# Patient Record
Sex: Female | Born: 1955
Health system: Southern US, Community
[De-identification: ages and names within clinical notes are randomized; demographics above are authoritative.]

## PROBLEM LIST (undated history)

## (undated) DIAGNOSIS — M549 Dorsalgia, unspecified: Secondary | ICD-10-CM

## (undated) DIAGNOSIS — M5432 Sciatica, left side: Secondary | ICD-10-CM

## (undated) DIAGNOSIS — G8929 Other chronic pain: Secondary | ICD-10-CM

## (undated) DIAGNOSIS — R112 Nausea with vomiting, unspecified: Secondary | ICD-10-CM

## (undated) DIAGNOSIS — Z9889 Other specified postprocedural states: Secondary | ICD-10-CM

## (undated) HISTORY — PX: MELANOMA EXCISION: SHX5266

## (undated) HISTORY — DX: Sciatica, left side: M54.32

## (undated) HISTORY — PX: ESOPHAGOGASTRODUODENOSCOPY: SHX1529

---

## 1997-12-16 ENCOUNTER — Ambulatory Visit (HOSPITAL_COMMUNITY): Admission: RE | Admit: 1997-12-16 | Discharge: 1997-12-16 | Payer: Self-pay | Admitting: *Deleted

## 1998-12-29 ENCOUNTER — Ambulatory Visit (HOSPITAL_COMMUNITY): Admission: RE | Admit: 1998-12-29 | Discharge: 1998-12-29 | Payer: Self-pay | Admitting: *Deleted

## 1999-05-12 ENCOUNTER — Other Ambulatory Visit: Admission: RE | Admit: 1999-05-12 | Discharge: 1999-05-12 | Payer: Self-pay | Admitting: *Deleted

## 2000-01-04 ENCOUNTER — Ambulatory Visit (HOSPITAL_COMMUNITY): Admission: RE | Admit: 2000-01-04 | Discharge: 2000-01-04 | Payer: Self-pay | Admitting: *Deleted

## 2000-06-06 ENCOUNTER — Other Ambulatory Visit: Admission: RE | Admit: 2000-06-06 | Discharge: 2000-06-06 | Payer: Self-pay | Admitting: *Deleted

## 2001-01-07 ENCOUNTER — Ambulatory Visit (HOSPITAL_COMMUNITY): Admission: RE | Admit: 2001-01-07 | Discharge: 2001-01-07 | Payer: Self-pay | Admitting: *Deleted

## 2001-06-11 ENCOUNTER — Other Ambulatory Visit: Admission: RE | Admit: 2001-06-11 | Discharge: 2001-06-11 | Payer: Self-pay | Admitting: *Deleted

## 2002-01-08 ENCOUNTER — Ambulatory Visit (HOSPITAL_COMMUNITY): Admission: RE | Admit: 2002-01-08 | Discharge: 2002-01-08 | Payer: Self-pay | Admitting: *Deleted

## 2002-03-13 HISTORY — PX: COLONOSCOPY: SHX174

## 2002-06-17 ENCOUNTER — Other Ambulatory Visit: Admission: RE | Admit: 2002-06-17 | Discharge: 2002-06-17 | Payer: Self-pay | Admitting: *Deleted

## 2002-09-22 ENCOUNTER — Encounter: Admission: RE | Admit: 2002-09-22 | Discharge: 2002-09-22 | Payer: Self-pay

## 2002-11-23 ENCOUNTER — Encounter: Payer: Self-pay | Admitting: Emergency Medicine

## 2002-11-23 ENCOUNTER — Emergency Department (HOSPITAL_COMMUNITY): Admission: EM | Admit: 2002-11-23 | Discharge: 2002-11-23 | Payer: Self-pay | Admitting: Emergency Medicine

## 2002-11-27 ENCOUNTER — Encounter: Payer: Self-pay | Admitting: Gastroenterology

## 2002-11-27 ENCOUNTER — Encounter: Admission: RE | Admit: 2002-11-27 | Discharge: 2002-11-27 | Payer: Self-pay | Admitting: Gastroenterology

## 2002-12-05 ENCOUNTER — Encounter (INDEPENDENT_AMBULATORY_CARE_PROVIDER_SITE_OTHER): Payer: Self-pay | Admitting: Specialist

## 2002-12-05 ENCOUNTER — Ambulatory Visit (HOSPITAL_COMMUNITY): Admission: RE | Admit: 2002-12-05 | Discharge: 2002-12-05 | Payer: Self-pay | Admitting: Gastroenterology

## 2002-12-15 ENCOUNTER — Encounter: Payer: Self-pay | Admitting: Gastroenterology

## 2002-12-15 ENCOUNTER — Ambulatory Visit (HOSPITAL_COMMUNITY): Admission: RE | Admit: 2002-12-15 | Discharge: 2002-12-15 | Payer: Self-pay | Admitting: Gastroenterology

## 2003-01-21 ENCOUNTER — Ambulatory Visit (HOSPITAL_COMMUNITY): Admission: RE | Admit: 2003-01-21 | Discharge: 2003-01-21 | Payer: Self-pay | Admitting: *Deleted

## 2003-04-10 ENCOUNTER — Encounter: Admission: RE | Admit: 2003-04-10 | Discharge: 2003-04-10 | Payer: Self-pay | Admitting: Gastroenterology

## 2003-07-21 ENCOUNTER — Other Ambulatory Visit: Admission: RE | Admit: 2003-07-21 | Discharge: 2003-07-21 | Payer: Self-pay | Admitting: *Deleted

## 2004-02-08 ENCOUNTER — Ambulatory Visit (HOSPITAL_COMMUNITY): Admission: RE | Admit: 2004-02-08 | Discharge: 2004-02-08 | Payer: Self-pay | Admitting: *Deleted

## 2005-03-07 ENCOUNTER — Ambulatory Visit (HOSPITAL_COMMUNITY): Admission: RE | Admit: 2005-03-07 | Discharge: 2005-03-07 | Payer: Self-pay | Admitting: *Deleted

## 2006-03-15 ENCOUNTER — Ambulatory Visit (HOSPITAL_COMMUNITY): Admission: RE | Admit: 2006-03-15 | Discharge: 2006-03-15 | Payer: Self-pay | Admitting: *Deleted

## 2006-05-29 ENCOUNTER — Ambulatory Visit: Payer: Self-pay | Admitting: Internal Medicine

## 2006-05-29 LAB — CONVERTED CEMR LAB
Basophils Relative: 1.7 % — ABNORMAL HIGH (ref 0.0–1.0)
Bilirubin, Direct: 0.2 mg/dL (ref 0.0–0.3)
CO2: 29 meq/L (ref 19–32)
Eosinophils Absolute: 0.1 10*3/uL (ref 0.0–0.6)
Eosinophils Relative: 1.2 % (ref 0.0–5.0)
Free T4: 0.8 ng/dL (ref 0.6–1.6)
GFR calc Af Amer: 98 mL/min
GFR calc non Af Amer: 81 mL/min
Glucose, Bld: 75 mg/dL (ref 70–99)
HCT: 39.8 % (ref 36.0–46.0)
Hemoglobin: 14 g/dL (ref 12.0–15.0)
Lymphocytes Relative: 27.5 % (ref 12.0–46.0)
MCV: 94.3 fL (ref 78.0–100.0)
Monocytes Absolute: 0.2 10*3/uL (ref 0.2–0.7)
Neutro Abs: 5.5 10*3/uL (ref 1.4–7.7)
Neutrophils Relative %: 67 % (ref 43.0–77.0)
Sed Rate: 11 mm/hr (ref 0–25)
Sodium: 140 meq/L (ref 135–145)
T3, Free: 3 pg/mL (ref 2.3–4.2)
Total Protein: 6.6 g/dL (ref 6.0–8.3)
WBC: 8.2 10*3/uL (ref 4.5–10.5)

## 2007-03-18 ENCOUNTER — Ambulatory Visit (HOSPITAL_COMMUNITY): Admission: RE | Admit: 2007-03-18 | Discharge: 2007-03-18 | Payer: Self-pay | Admitting: Obstetrics and Gynecology

## 2007-10-22 ENCOUNTER — Encounter: Payer: Self-pay | Admitting: Internal Medicine

## 2008-01-09 ENCOUNTER — Ambulatory Visit: Payer: Self-pay | Admitting: Internal Medicine

## 2008-01-09 DIAGNOSIS — Z85828 Personal history of other malignant neoplasm of skin: Secondary | ICD-10-CM | POA: Insufficient documentation

## 2008-01-09 DIAGNOSIS — Z85820 Personal history of malignant melanoma of skin: Secondary | ICD-10-CM | POA: Insufficient documentation

## 2008-02-25 ENCOUNTER — Telehealth (INDEPENDENT_AMBULATORY_CARE_PROVIDER_SITE_OTHER): Payer: Self-pay | Admitting: *Deleted

## 2008-03-23 ENCOUNTER — Ambulatory Visit (HOSPITAL_COMMUNITY): Admission: RE | Admit: 2008-03-23 | Discharge: 2008-03-23 | Payer: Self-pay | Admitting: Obstetrics and Gynecology

## 2009-03-29 ENCOUNTER — Ambulatory Visit (HOSPITAL_COMMUNITY): Admission: RE | Admit: 2009-03-29 | Discharge: 2009-03-29 | Payer: Self-pay | Admitting: Obstetrics and Gynecology

## 2010-03-13 HISTORY — PX: CERVICAL SPINE SURGERY: SHX589

## 2010-03-30 ENCOUNTER — Ambulatory Visit (HOSPITAL_COMMUNITY)
Admission: RE | Admit: 2010-03-30 | Discharge: 2010-03-30 | Payer: Self-pay | Source: Home / Self Care | Attending: Obstetrics and Gynecology | Admitting: Obstetrics and Gynecology

## 2010-04-06 ENCOUNTER — Encounter
Admission: RE | Admit: 2010-04-06 | Discharge: 2010-04-06 | Payer: Self-pay | Source: Home / Self Care | Attending: Obstetrics and Gynecology | Admitting: Obstetrics and Gynecology

## 2010-04-13 ENCOUNTER — Encounter: Payer: Self-pay | Admitting: Obstetrics and Gynecology

## 2010-07-29 NOTE — Assessment & Plan Note (Signed)
[facility]                        [place] OFFICE NOTE   Deborah, Mccormick                       MRN:          [id number]  DATE:05/29/2006                            DOB:          1955-08-10    Was seen 05/29/2006 for an acute problem.   On Saturday March 15 she was descending the stairs at approximating 4  p.m. when she noted blurring of the vision in her left eye.  This was  associated with localized retroorbital pressure.  She described it as if  the eye were covered in mucus or under water.  She took out her  contacts and did note some clearing, but she had residual pressure.  To  this point she feels like something is in the eye, but she has  difficulty clarifying or defining it better.   PAST MEDICAL HISTORY:  Reveals 2 pregnancies and 2 deliveries.  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  She was treated as an outpatient in the emergency room for  gastroenteritis.   Her father died of cancer of unknown primary.  Apparently he was  estranged from the family for almost 2 decades.  Mother had a myocardial  infarction with coronary bypass grafting.   Tristine is very physically active, exercising regularly in a gym.  She is  on no diet.   She is allergic to a PRESERVATIVE IN FLU SHOTS, METHIMAZOLE.   She is on glucosamine, sucralfate, Naprosyn, Centrum, Calcium Plus D and  a birth control pill.  She has been followed by Dr. Gildardo Griffes .   REVIEW OF SYSTEMS:  Completed in toto.  She has no cardiopulmonary  symptoms or other neurologic or ophthalmologic symptoms.   She has noted that she has difficulty wearing her rings and questions  were it may be some enlargement of the knuckles rather than edema.She  feels NSAIDs are invaluable in allowing to continue high levels of  cardiovascular exercise.   PHYSICAL EXAMINATION:  She is almost 5 foot 3 inches, weighs 120,  temperature is  97.6, pulse 80, respiratory rate 16, blood pressure  110/78.  Pupils were equal and round and reactive to light.  There is no abnormal  pigmentation of the eyelids.  The ophthalmologic exam was unremarkable.  Field of vision and extraocular motions are normal.  She does exhibit  some lid lag.  She has no carotid bruits.  There is no lymphadenopathy about the head, neck or axilla.  The thyroid has a slightly irregular surface, but no definite  nodularity.  She has an S4 with no murmurs or gallops.  Chest was clear to auscultation.  There is no organomegaly or masses.  The dorsalis pedis pulses are slightly decreased.  She has no edema,  visible or palpable.  The neurologic exam revealed no deficits although she had minimal  difficulty with  rapid alliteration.   Air conduction is clearer than bone conduction and there is no  lateralization with tuning fork.  She did state she questioned some  hearing loss, but this was not documented  on the exam.   She has an appointment to see her ophthalmologist on the 24th, and I  will encourage her to keep that.   I will perform CMET, CBC and diff, sed rate and full thyroid function  tests.   She did provide me with copies of a lipid profile from August 2007,  which was excellent.  TSH was normal at that time.   If these studies are negative and the ophthalmology exam is unremarkable  and symptoms persist, then I would recommend neuroimaging.  I will ask  her to take a baby-coated aspirin each day.     Titus Dubin. Alwyn Ren, MD,FACP,FCCP  Electronically Signed    [facility]/MedQ  DD: 05/29/2006  DT: 05/30/2006  Job #: [id number]   cc:   Bayard Beaver. Nelle Don, M.D.

## 2010-07-29 NOTE — Op Note (Signed)
   NAME:  [name], [name]                          ACCOUNT NO.:  [id number]   MEDICAL RECORD NO.:  [id number]                   PATIENT TYPE:  AMB   LOCATION:  ENDO                                 FACILITY:  [facility]   PHYSICIAN:  Bernette Redbird, M.D.                DATE OF BIRTH:  11-Jan-1956   DATE OF PROCEDURE:  12/05/2002  DATE OF DISCHARGE:                                 OPERATIVE REPORT   PROCEDURE:  Colonoscopy with biopsies.   INDICATION:  Diffuse abdominal pain and diarrhea occurring in an episodic  fashion in a healthy 55 year old female.   FINDINGS:  Normal exam to the terminal ileum.   DESCRIPTION OF PROCEDURE:  The nature, purpose, and risks of the procedure  had been discussed with the patient who provided written consent.  Sedation  for this procedure and the upper endoscopy which preceded it totaled  fentanyl 100 mcg and Versed 9 mg IV without arrhythmias or desaturation.  The Olympus adjustable-tension pediatric video colonoscope was advanced with  mild to moderate difficulty due to some looping in this petite patient.  Ultimately, with the patient in the supine position and external abdominal  compression, I was able to enter the terminal ileum which had a normal  appearance.  Biopsies were obtained, and pullback was then performed in a  gradual fashion around the colon.  The quality of the prep was excellent,  and it is felt that all areas were well-seen.   The colonic mucosa looked normal.  No polyps, cancer, colitis, vascular  malformations, or diverticulosis were noted, and retroflexion in the rectum  as well as re-inspection of the rectum was unremarkable.  Random mucosal  biopsies were obtained along the length of the colon.  The patient tolerated  the procedure well, and there were no apparent complications.   IMPRESSION:  Normal colonoscopy.  No source of symptoms endoscopically  evident.   PLAN:  Await pathology results.                   Bernette Redbird, M.D.    RB/MEDQ  D:  12/05/2002  T:  12/05/2002  Job:  [id number]   cc:   Christella Noa, M.D.  [address]  [place], [place] [address]  Fax: [phone]

## 2010-07-29 NOTE — Op Note (Signed)
   NAME:  [name], [name]                          ACCOUNT NO.:  [id number]   MEDICAL RECORD NO.:  [id number]                   PATIENT TYPE:  AMB   LOCATION:  ENDO                                 [facility]ACILITY:  [facility]   PHYSICIAN:  Bernette Redbird, M.D.                DATE OF BIRTH:  03-18-1955   DATE OF PROCEDURE:  12/05/2002  DATE OF DISCHARGE:                                 OPERATIVE REPORT   PROCEDURE:  Upper endoscopy with biopsies.   INDICATION:  A 55 year old female with nonspecific abdominal symptoms,  including pain and diarrhea.   FINDINGS:  Mild bile reflux and questionable duodenal mucosal villous  atrophy.   DESCRIPTION OF PROCEDURE:  The nature, purpose, and risks of the procedure  had been discussed with the patient who provided written consent.  Sedation  was fentanyl 100 mcg and Versed 9 mg IV prior to and during this exam and  the colonoscopy which followed it.  The Olympus small-caliber adult video  endoscope was passed under direct vision.  The esophagus was normal without  evidence of reflux esophagitis, Barrett's esophagus, varices, infection, or  neoplasia.  No ring, stricture, or hiatal hernia was appreciated.   The stomach contained a small amount of bile and had some faint erythema of  the gastric mucosa but nothing that I would really consider bile reflux  gastritis.  No focal lesions such as erosions, ulcers, polyps, or masses  were seen, and retroflexed viewing of the cardia was unremarkable.  The  pylorus was normal.   The duodenal bulb and second duodenum, however, appeared abnormal.  There  were fewer duodenal mucosal folds than normal, there was faint  hypervascularity, some minimal scalloping of the edges of the duodenal  folds, and a glistening smooth surface just above villous atrophy of the  duodenal mucosa.  Multiple biopsies were obtained prior to removal of the  scope.   The patient tolerated the procedure well, and there were no apparent  complications.   IMPRESSION:  1. Mild bile reflux, ? gastric motility disorder.  2. 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    PLAN:  1. Await pathology on duodenal and gastric biopsies.  2. Proceed to colonoscopic evaluation.                                               Bernette Redbird, M.D.    RB/MEDQ  D:  12/05/2002  T:  12/05/2002  Job:  [id number]   cc:   Christella Noa, M.D.  [address]  [place], [place] [address]  Fax: [phone]

## 2010-09-05 ENCOUNTER — Ambulatory Visit
Admission: RE | Admit: 2010-09-05 | Discharge: 2010-09-05 | Disposition: A | Discharge status: Home / Self Care | Payer: Managed Care, Other (non HMO) | Source: Ambulatory Visit | Attending: Neurosurgery | Admitting: Neurosurgery

## 2010-09-05 ENCOUNTER — Other Ambulatory Visit: Payer: Self-pay | Admitting: Neurosurgery

## 2010-09-05 DIAGNOSIS — M542 Cervicalgia: Secondary | ICD-10-CM

## 2010-09-05 DIAGNOSIS — R51 Headache: Secondary | ICD-10-CM

## 2010-09-05 DIAGNOSIS — M5000 Cervical disc disorder with myelopathy, unspecified cervical region: Secondary | ICD-10-CM

## 2010-09-05 DIAGNOSIS — M5030 Other cervical disc degeneration, unspecified cervical region: Secondary | ICD-10-CM

## 2010-09-23 ENCOUNTER — Encounter (HOSPITAL_COMMUNITY)
Admission: RE | Admit: 2010-09-23 | Discharge: 2010-09-23 | Disposition: A | Discharge status: Home / Self Care | Payer: Managed Care, Other (non HMO) | Source: Ambulatory Visit | Attending: Neurosurgery | Admitting: Neurosurgery

## 2010-09-23 LAB — CBC
HCT: 42 % (ref 36.0–46.0)
MCH: 32.8 pg (ref 26.0–34.0)
MCV: 91.9 fL (ref 78.0–100.0)
Platelets: 304 10*3/uL (ref 150–400)
RBC: 4.57 MIL/uL (ref 3.87–5.11)
WBC: 8.1 10*3/uL (ref 4.0–10.5)

## 2010-09-30 ENCOUNTER — Observation Stay (HOSPITAL_COMMUNITY)
Admission: RE | Admit: 2010-09-30 | Discharge: 2010-10-01 | Disposition: A | Discharge status: Home / Self Care | Payer: Managed Care, Other (non HMO) | Source: Ambulatory Visit | Attending: Neurosurgery | Admitting: Neurosurgery

## 2010-09-30 ENCOUNTER — Ambulatory Visit (HOSPITAL_COMMUNITY): Payer: Managed Care, Other (non HMO)

## 2010-09-30 DIAGNOSIS — Z79899 Other long term (current) drug therapy: Secondary | ICD-10-CM | POA: Insufficient documentation

## 2010-09-30 DIAGNOSIS — M47812 Spondylosis without myelopathy or radiculopathy, cervical region: Principal | ICD-10-CM | POA: Insufficient documentation

## 2010-09-30 DIAGNOSIS — Z01812 Encounter for preprocedural laboratory examination: Secondary | ICD-10-CM | POA: Insufficient documentation

## 2010-10-18 ENCOUNTER — Other Ambulatory Visit: Payer: Self-pay | Admitting: Neurosurgery

## 2010-10-18 ENCOUNTER — Ambulatory Visit
Admission: RE | Admit: 2010-10-18 | Discharge: 2010-10-18 | Disposition: A | Discharge status: Home / Self Care | Payer: Managed Care, Other (non HMO) | Source: Ambulatory Visit | Attending: Neurosurgery | Admitting: Neurosurgery

## 2010-10-18 DIAGNOSIS — M542 Cervicalgia: Secondary | ICD-10-CM

## 2010-10-26 NOTE — Op Note (Signed)
[name], [name]                ACCOUNT NO.:  [id number]  MEDICAL RECORD NO.:  [id number]  LOCATION:  3524                         FACILITY:  [facility]  PHYSICIAN:  Donalee Citrin, M.D.        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  PROCEDURE:  Intracervical diskectomies and fusion at C4-5, C5-6 using 7- mm PEEK cages packed with locally harvested autograft mixed with Actifuse and a globus addition plating system using six 12 mm variable angle screws.  SURGEON:  Donalee Citrin, MD  ASSISTANT:  Yetta Barre.  ANESTHESIA:  General endotracheal.  HISTORY OF PRESENT ILLNESS:  The patient is a very pleasant 55 year old female who has had progressive worsening neck pain with pain radiating in the both shoulders and arms with numbness and tingling in her hand and weakness in her hands.  MRI scan showed severe stenosis, spondylosis and kyphosis, prominently centered on C4-5 as well as C5-6.  Due to the patient's failed conservative treatment with physical therapy, anti- inflammatories, steroid, the patient is recommended anterior cervical diskectomy and fusion.  I went over the risks and benefits with her, she understands and agreed to proceed forth.  The patient was brought to the OR, was induced under general anesthesia, positioned supine with neck in slight extension, 5 pounds of Halter traction.  The right side of her neck was prepped in the usual sterile fashion.  Preop incision was localized at the appropriate level, so a curvilinear incision was made just off the midline to the anterior portion of the sternocleidomastoid.  The superficial layer of the platysma was dissected out and divided longitudinally.  The avascular plane between the sternocleidomastoid and strap muscle was developed down to the  prevertebral fascia.  Prevertebral fascia was dissected with Kittners.  Intraoperative x-ray identified the appropriate level, so annulotomies were made with 15-blade scalpel, marked the disk space and the longus colli was reflected laterally.  Self-retaining retractor was placed.  Large anterior osteophytes bitten off the C4 and C5 vertebral bodies respectively.  The C4-5 interspace was noted to be markedly hypermobile with signs of instability.  C5-6 was also seem to be more hypermobile but normal.  Both disk spaces were then drilled down capturing the bone shavings and mucus trap.  First working at C4-5, the operative microscope draped, brought into field under microscope elimination.  C4-5 was further drilled down the posterior osteophytic complex.  Aggressive under biting of both endplates helped to decompress the central canal.  The PLL was removed in a piecemeal fashion.  Large posterior aspect coming off the C4 vertebral body was displacing in its spinal cord centrally.  This was all aggressively underbite.  Marching across laterally, the C5 pedicles were identified and the C5 nerve foramina were decompressed, flushed with pedicle easily accepting a nerve hook, foramen to confirm adequate patency of then neural foramina at C5.  The endplates were then again inspected and under biting of both endplates to decompress the central canal screw.  Then they were packed with  Gelfoam.  Attention was taken to C5-6 in a similar fashion.  There was a fair amount of osteophyte coming off C5 vertebral body displacing the spinal cord predominantly left of center at C5-6.  This was all aggressively under bitten marching across laterally.  The C6 pedicle was identified.  C6 nerve root was flushed with pedicle.  There was a fair amount of uncinate hypertrophy at C5-6 on the left.  This was all causing marked foraminal stenosis.  This was all under bitten until the C6 nerve root was decompressed,  flushed with pedicle easily accepting nerve root.  After all, foramina were again reinspected to confirm patency, endplates were scraped with a BA curette.  Two 7-mm PEEK cages packed with local autograft mixed with Actifuse and then inserted.  Then a Globus Revere plate was selected, 12 mm screws placed, this one screw at C5 was replaced with a rescue screw and locking mechanisms were engaged.  The wound was copiously irrigated, meticulous hemostasis was maintained, and the wound was closed in layers with interrupted Vicryl in the platysma and a running 4-0 subcuticular. Benzoin and Steri-Strips were applied.  The patient went to recovery room in stable condition. At the end of the case, sponge and instrument counts were correct.          ______________________________ Donalee Citrin, M.D.     GC/MEDQ  D:  09/30/2010  T:  09/30/2010  Job:  [id number]  Electronically Signed by Donalee Citrin M.D. on 10/26/2010 10:17:33 AM

## 2010-11-15 ENCOUNTER — Other Ambulatory Visit: Payer: Self-pay | Admitting: Neurosurgery

## 2010-11-15 ENCOUNTER — Ambulatory Visit
Admission: RE | Admit: 2010-11-15 | Discharge: 2010-11-15 | Disposition: A | Discharge status: Home / Self Care | Payer: Managed Care, Other (non HMO) | Source: Ambulatory Visit | Attending: Neurosurgery | Admitting: Neurosurgery

## 2010-11-15 DIAGNOSIS — M542 Cervicalgia: Secondary | ICD-10-CM

## 2010-12-26 ENCOUNTER — Other Ambulatory Visit: Payer: Self-pay | Admitting: Neurosurgery

## 2010-12-26 ENCOUNTER — Ambulatory Visit
Admission: RE | Admit: 2010-12-26 | Discharge: 2010-12-26 | Disposition: A | Discharge status: Home / Self Care | Payer: Managed Care, Other (non HMO) | Source: Ambulatory Visit | Attending: Neurosurgery | Admitting: Neurosurgery

## 2010-12-26 DIAGNOSIS — M542 Cervicalgia: Secondary | ICD-10-CM

## 2010-12-26 DIAGNOSIS — M5030 Other cervical disc degeneration, unspecified cervical region: Secondary | ICD-10-CM

## 2010-12-27 ENCOUNTER — Other Ambulatory Visit: Payer: Self-pay | Admitting: Neurosurgery

## 2010-12-27 DIAGNOSIS — M545 Low back pain, unspecified: Secondary | ICD-10-CM

## 2010-12-31 ENCOUNTER — Ambulatory Visit
Admission: RE | Admit: 2010-12-31 | Discharge: 2010-12-31 | Disposition: A | Discharge status: Home / Self Care | Payer: Managed Care, Other (non HMO) | Source: Ambulatory Visit | Attending: Neurosurgery | Admitting: Neurosurgery

## 2010-12-31 DIAGNOSIS — M545 Low back pain, unspecified: Secondary | ICD-10-CM

## 2011-02-16 ENCOUNTER — Other Ambulatory Visit: Payer: Self-pay | Admitting: Neurosurgery

## 2011-02-16 DIAGNOSIS — M545 Low back pain, unspecified: Secondary | ICD-10-CM

## 2011-02-20 ENCOUNTER — Other Ambulatory Visit: Payer: Managed Care, Other (non HMO)

## 2011-02-21 ENCOUNTER — Ambulatory Visit
Admission: RE | Admit: 2011-02-21 | Discharge: 2011-02-21 | Disposition: A | Discharge status: Home / Self Care | Payer: Managed Care, Other (non HMO) | Source: Ambulatory Visit | Attending: Neurosurgery | Admitting: Neurosurgery

## 2011-02-21 DIAGNOSIS — M545 Low back pain, unspecified: Secondary | ICD-10-CM

## 2011-02-21 MED ORDER — METHYLPREDNISOLONE ACETATE 40 MG/ML INJ SUSP (RADIOLOG
120.0000 mg | Freq: Once | INTRAMUSCULAR | Status: AC
  Administered 2011-02-21: 120 mg via EPIDURAL

## 2011-02-21 MED ORDER — IOHEXOL 180 MG/ML  SOLN
1.0000 mL | Freq: Once | INTRAMUSCULAR | Status: AC | PRN
  Administered 2011-02-21: 1 mL via EPIDURAL

## 2011-02-21 NOTE — Patient Instructions (Signed)

## 2011-02-28 ENCOUNTER — Other Ambulatory Visit: Payer: Self-pay | Admitting: Neurosurgery

## 2011-02-28 DIAGNOSIS — M545 Low back pain, unspecified: Secondary | ICD-10-CM

## 2011-03-08 ENCOUNTER — Ambulatory Visit
Admission: RE | Admit: 2011-03-08 | Discharge: 2011-03-08 | Disposition: A | Discharge status: Home / Self Care | Payer: Managed Care, Other (non HMO) | Source: Ambulatory Visit | Attending: Neurosurgery | Admitting: Neurosurgery

## 2011-03-08 ENCOUNTER — Other Ambulatory Visit: Payer: Self-pay | Admitting: Neurosurgery

## 2011-03-08 VITALS — BP 132/80 | HR 77 | Resp 14

## 2011-03-08 DIAGNOSIS — M545 Low back pain, unspecified: Secondary | ICD-10-CM

## 2011-03-08 MED ORDER — METHYLPREDNISOLONE ACETATE 40 MG/ML INJ SUSP (RADIOLOG
120.0000 mg | Freq: Once | INTRAMUSCULAR | Status: AC
  Administered 2011-03-08: 120 mg via EPIDURAL

## 2011-03-08 MED ORDER — IOHEXOL 180 MG/ML  SOLN
1.0000 mL | Freq: Once | INTRAMUSCULAR | Status: AC | PRN
  Administered 2011-03-08: 1 mL via EPIDURAL

## 2011-03-08 NOTE — Progress Notes (Signed)
[phone]  Patient states that she is experiencing some numbness of LLE and discomfort (same as pre-procedure).  Dr Karin Golden in to see patient & reviewed verbal discharge instructions.  0855  Ambulates alone, gait steady.  0900  Daughter present to drive.  Patient discharged to home

## 2011-03-20 ENCOUNTER — Other Ambulatory Visit (HOSPITAL_COMMUNITY): Payer: Self-pay | Admitting: Obstetrics

## 2011-03-20 DIAGNOSIS — Z1231 Encounter for screening mammogram for malignant neoplasm of breast: Secondary | ICD-10-CM

## 2011-03-31 ENCOUNTER — Other Ambulatory Visit: Payer: Managed Care, Other (non HMO)

## 2011-04-14 ENCOUNTER — Ambulatory Visit (HOSPITAL_COMMUNITY)
Admission: RE | Admit: 2011-04-14 | Discharge: 2011-04-14 | Disposition: A | Discharge status: Home / Self Care | Payer: Managed Care, Other (non HMO) | Source: Ambulatory Visit | Attending: Obstetrics | Admitting: Obstetrics

## 2011-04-14 DIAGNOSIS — Z1231 Encounter for screening mammogram for malignant neoplasm of breast: Secondary | ICD-10-CM | POA: Insufficient documentation

## 2011-04-20 ENCOUNTER — Other Ambulatory Visit: Payer: Self-pay | Admitting: Obstetrics

## 2011-04-20 DIAGNOSIS — R928 Other abnormal and inconclusive findings on diagnostic imaging of breast: Secondary | ICD-10-CM

## 2011-04-27 ENCOUNTER — Ambulatory Visit
Admission: RE | Admit: 2011-04-27 | Discharge: 2011-04-27 | Disposition: A | Discharge status: Home / Self Care | Payer: Managed Care, Other (non HMO) | Source: Ambulatory Visit | Attending: Obstetrics | Admitting: Obstetrics

## 2011-04-27 DIAGNOSIS — R928 Other abnormal and inconclusive findings on diagnostic imaging of breast: Secondary | ICD-10-CM

## 2011-05-08 ENCOUNTER — Other Ambulatory Visit: Payer: Self-pay | Admitting: Neurosurgery

## 2011-05-08 ENCOUNTER — Ambulatory Visit
Admission: RE | Admit: 2011-05-08 | Discharge: 2011-05-08 | Disposition: A | Discharge status: Home / Self Care | Payer: Managed Care, Other (non HMO) | Source: Ambulatory Visit | Attending: Neurosurgery | Admitting: Neurosurgery

## 2011-05-08 DIAGNOSIS — M545 Low back pain, unspecified: Secondary | ICD-10-CM

## 2011-05-18 ENCOUNTER — Other Ambulatory Visit: Payer: Self-pay | Admitting: Neurosurgery

## 2011-05-18 ENCOUNTER — Ambulatory Visit
Admission: RE | Admit: 2011-05-18 | Discharge: 2011-05-18 | Disposition: A | Discharge status: Home / Self Care | Payer: Managed Care, Other (non HMO) | Source: Ambulatory Visit | Attending: Neurosurgery | Admitting: Neurosurgery

## 2011-05-18 DIAGNOSIS — M25552 Pain in left hip: Secondary | ICD-10-CM

## 2011-05-26 ENCOUNTER — Other Ambulatory Visit: Payer: Self-pay | Admitting: Neurosurgery

## 2011-05-26 DIAGNOSIS — M25552 Pain in left hip: Secondary | ICD-10-CM

## 2011-05-27 ENCOUNTER — Ambulatory Visit
Admission: RE | Admit: 2011-05-27 | Discharge: 2011-05-27 | Disposition: A | Discharge status: Home / Self Care | Payer: Managed Care, Other (non HMO) | Source: Ambulatory Visit | Attending: Neurosurgery | Admitting: Neurosurgery

## 2011-05-27 DIAGNOSIS — M25552 Pain in left hip: Secondary | ICD-10-CM

## 2011-06-01 ENCOUNTER — Encounter (HOSPITAL_COMMUNITY): Payer: Self-pay | Admitting: Pharmacy Technician

## 2011-06-02 NOTE — Pre-Procedure Instructions (Signed)
20 [name]  06/02/2011   Your procedure is scheduled on:  Mon, Mar 26   Report to [facility] @ [phone] @ 8:00 AM on day of surgery to find out arrival time  Call this number if you have problems the morning of surgery: [phone]   Remember:   Do not eat food:After Midnight.  May have clear liquids: up to 4 Hours before arrival.(until 4:00 am)  Clear liquids include soda, tea, black coffee, apple or grape juice, broth,water  Take these medicines the morning of surgery with A SIP OF WATER:    Do not wear jewelry, make-up or nail polish.  Do not wear lotions, powders, or perfumes.   Do not shave 48 hours prior to surgery.  Do not bring valuables to the hospital.  Contacts, dentures or bridgework may not be worn into surgery.  Leave suitcase in the car. After surgery it may be brought to your room.  For patients admitted to the hospital, checkout time is 11:00 AM the day of discharge.   Patients discharged the day of surgery will not be allowed to drive home.  Special Instructions: CHG Shower Use Special Wash: 1/2 bottle night before surgery and 1/2 bottle morning of surgery.   Please read over the following fact sheets that you were given: Pain Booklet, Coughing and Deep Breathing, MRSA Information and Surgical Site Infection Prevention

## 2011-06-05 ENCOUNTER — Encounter (HOSPITAL_COMMUNITY)
Admission: RE | Admit: 2011-06-05 | Discharge: 2011-06-05 | Disposition: A | Discharge status: Home / Self Care | Payer: Managed Care, Other (non HMO) | Source: Ambulatory Visit | Attending: Neurosurgery | Admitting: Neurosurgery

## 2011-06-05 ENCOUNTER — Encounter (HOSPITAL_COMMUNITY): Payer: Self-pay

## 2011-06-05 HISTORY — DX: Other chronic pain: G89.29

## 2011-06-05 HISTORY — DX: Other specified postprocedural states: R11.2

## 2011-06-05 HISTORY — DX: Other specified postprocedural states: Z98.890

## 2011-06-05 HISTORY — DX: Dorsalgia, unspecified: M54.9

## 2011-06-05 LAB — CBC
HCT: 39.4 % (ref 36.0–46.0)
Hemoglobin: 13.9 g/dL (ref 12.0–15.0)
MCH: 33 pg (ref 26.0–34.0)
MCHC: 35.3 g/dL (ref 30.0–36.0)

## 2011-06-05 MED ORDER — VANCOMYCIN HCL 500 MG IV SOLR
500.0000 mg | Freq: Once | INTRAVENOUS | Status: DC
  Filled 2011-06-05: qty 500

## 2011-06-05 MED ORDER — DEXAMETHASONE SODIUM PHOSPHATE 10 MG/ML IJ SOLN
10.0000 mg | Freq: Once | INTRAMUSCULAR | Status: DC
  Filled 2011-06-05: qty 1

## 2011-06-05 NOTE — Progress Notes (Signed)
Pt doesn't have a cardiologist  Medical MD is [employer]) but doesn't see anyone on a regular basis  Denies ever having an echo/stress test/heart cath

## 2011-06-06 ENCOUNTER — Ambulatory Visit (HOSPITAL_COMMUNITY)
Admission: RE | Admit: 2011-06-06 | Discharge: 2011-06-07 | Disposition: A | Discharge status: Home / Self Care | Payer: Managed Care, Other (non HMO) | Source: Ambulatory Visit | Attending: Neurosurgery | Admitting: Neurosurgery

## 2011-06-06 ENCOUNTER — Encounter (HOSPITAL_COMMUNITY): Payer: Self-pay | Admitting: Certified Registered Nurse Anesthetist

## 2011-06-06 ENCOUNTER — Ambulatory Visit (HOSPITAL_COMMUNITY): Payer: Managed Care, Other (non HMO) | Admitting: Certified Registered Nurse Anesthetist

## 2011-06-06 ENCOUNTER — Encounter (HOSPITAL_COMMUNITY): Payer: Self-pay | Admitting: Surgery

## 2011-06-06 ENCOUNTER — Ambulatory Visit (HOSPITAL_COMMUNITY): Payer: Managed Care, Other (non HMO)

## 2011-06-06 ENCOUNTER — Encounter (HOSPITAL_COMMUNITY)
Admission: RE | Disposition: A | Discharge status: Home / Self Care | Payer: Self-pay | Source: Ambulatory Visit | Attending: Neurosurgery

## 2011-06-06 DIAGNOSIS — M5126 Other intervertebral disc displacement, lumbar region: Secondary | ICD-10-CM | POA: Insufficient documentation

## 2011-06-06 DIAGNOSIS — Z87891 Personal history of nicotine dependence: Secondary | ICD-10-CM | POA: Insufficient documentation

## 2011-06-06 DIAGNOSIS — Z01812 Encounter for preprocedural laboratory examination: Secondary | ICD-10-CM | POA: Insufficient documentation

## 2011-06-06 DIAGNOSIS — M48061 Spinal stenosis, lumbar region without neurogenic claudication: Secondary | ICD-10-CM | POA: Insufficient documentation

## 2011-06-06 HISTORY — PX: LUMBAR LAMINECTOMY/DECOMPRESSION MICRODISCECTOMY: SHX5026

## 2011-06-06 SURGERY — LUMBAR LAMINECTOMY/DECOMPRESSION MICRODISCECTOMY 1 LEVEL
Anesthesia: General | Site: Back | Laterality: Left | Wound class: Clean

## 2011-06-06 MED ORDER — BACITRACIN 50000 UNITS IM SOLR
INTRAMUSCULAR | Status: DC | PRN
  Administered 2011-06-06: 18:00:00

## 2011-06-06 MED ORDER — PROPOFOL 10 MG/ML IV EMUL
INTRAVENOUS | Status: DC | PRN
  Administered 2011-06-06: 200 mg via INTRAVENOUS

## 2011-06-06 MED ORDER — SODIUM CHLORIDE 0.9 % IJ SOLN
3.0000 mL | Freq: Two times a day (BID) | INTRAMUSCULAR | Status: DC
  Administered 2011-06-06: 3 mL via INTRAVENOUS

## 2011-06-06 MED ORDER — BACITRACIN 50000 UNITS IM SOLR
INTRAMUSCULAR | Status: AC
  Filled 2011-06-06: qty 1

## 2011-06-06 MED ORDER — ESTRADIOL 0.05 MG/24HR TD PTWK
0.0500 mg | MEDICATED_PATCH | TRANSDERMAL | Status: DC
  Filled 2011-06-06: qty 1

## 2011-06-06 MED ORDER — NEOSTIGMINE METHYLSULFATE 1 MG/ML IJ SOLN
INTRAMUSCULAR | Status: DC | PRN
  Administered 2011-06-06: 3 mg via INTRAVENOUS

## 2011-06-06 MED ORDER — CEFAZOLIN SODIUM 1-5 GM-% IV SOLN: 1.0000 g | Freq: Three times a day (TID) | INTRAVENOUS | Status: DC

## 2011-06-06 MED ORDER — ACETAMINOPHEN 650 MG RE SUPP: 650.0000 mg | RECTAL | Status: DC | PRN

## 2011-06-06 MED ORDER — ONDANSETRON HCL 4 MG/2ML IJ SOLN
INTRAMUSCULAR | Status: DC | PRN
  Administered 2011-06-06: 4 mg via INTRAVENOUS

## 2011-06-06 MED ORDER — LIDOCAINE HCL (CARDIAC) 20 MG/ML IV SOLN
INTRAVENOUS | Status: DC | PRN
  Administered 2011-06-06: 60 mg via INTRAVENOUS

## 2011-06-06 MED ORDER — 0.9 % SODIUM CHLORIDE (POUR BTL) OPTIME
TOPICAL | Status: DC | PRN
  Administered 2011-06-06: 1000 mL

## 2011-06-06 MED ORDER — SODIUM CHLORIDE 0.9 % IV SOLN
INTRAVENOUS | Status: AC
  Filled 2011-06-06: qty 500

## 2011-06-06 MED ORDER — ACETAMINOPHEN 325 MG PO TABS: 650.0000 mg | ORAL_TABLET | ORAL | Status: DC | PRN

## 2011-06-06 MED ORDER — DROPERIDOL 2.5 MG/ML IJ SOLN
INTRAMUSCULAR | Status: DC | PRN
  Administered 2011-06-06: 0.625 mg via INTRAVENOUS

## 2011-06-06 MED ORDER — VITAMIN D3 25 MCG (1000 UNIT) PO TABS
2000.0000 [IU] | ORAL_TABLET | Freq: Every day | ORAL | Status: DC
  Administered 2011-06-06: 2000 [IU] via ORAL
  Filled 2011-06-06 (×2): qty 2

## 2011-06-06 MED ORDER — LACTATED RINGERS IV SOLN
INTRAVENOUS | Status: DC | PRN
  Administered 2011-06-06 (×2): via INTRAVENOUS

## 2011-06-06 MED ORDER — GLYCOPYRROLATE 0.2 MG/ML IJ SOLN
INTRAMUSCULAR | Status: DC | PRN
  Administered 2011-06-06: .4 mg via INTRAVENOUS

## 2011-06-06 MED ORDER — THROMBIN 5000 UNITS EX KIT
PACK | CUTANEOUS | Status: DC | PRN
  Administered 2011-06-06 (×2): 5000 [IU] via TOPICAL

## 2011-06-06 MED ORDER — ONDANSETRON HCL 4 MG/2ML IJ SOLN: 4.0000 mg | INTRAMUSCULAR | Status: DC | PRN

## 2011-06-06 MED ORDER — ONDANSETRON HCL 4 MG/2ML IJ SOLN: 4.0000 mg | Freq: Four times a day (QID) | INTRAMUSCULAR | Status: DC | PRN

## 2011-06-06 MED ORDER — HYDROMORPHONE HCL PF 1 MG/ML IJ SOLN
0.5000 mg | INTRAMUSCULAR | Status: DC | PRN
  Administered 2011-06-07: 0.5 mg via INTRAVENOUS
  Filled 2011-06-06: qty 1

## 2011-06-06 MED ORDER — CALCIUM CARBONATE-VITAMIN D 500-200 MG-UNIT PO TABS
1.0000 | ORAL_TABLET | Freq: Every day | ORAL | Status: DC
  Administered 2011-06-06: 1 via ORAL
  Filled 2011-06-06 (×2): qty 1

## 2011-06-06 MED ORDER — ROCURONIUM BROMIDE 100 MG/10ML IV SOLN
INTRAVENOUS | Status: DC | PRN
  Administered 2011-06-06: 50 mg via INTRAVENOUS

## 2011-06-06 MED ORDER — MENTHOL 3 MG MT LOZG: 1.0000 | LOZENGE | OROMUCOSAL | Status: DC | PRN

## 2011-06-06 MED ORDER — SCOPOLAMINE 1 MG/3DAYS TD PT72
MEDICATED_PATCH | TRANSDERMAL | Status: DC | PRN
  Administered 2011-06-06: 1 via TRANSDERMAL

## 2011-06-06 MED ORDER — EPHEDRINE SULFATE 50 MG/ML IJ SOLN
INTRAMUSCULAR | Status: DC | PRN
  Administered 2011-06-06: 10 mg via INTRAVENOUS
  Administered 2011-06-06: 5 mg via INTRAVENOUS

## 2011-06-06 MED ORDER — DEXAMETHASONE SODIUM PHOSPHATE 10 MG/ML IJ SOLN
INTRAMUSCULAR | Status: DC | PRN
  Administered 2011-06-06: 10 mg via INTRAVENOUS

## 2011-06-06 MED ORDER — CALCIUM-VITAMIN D 600-200 MG-UNIT PO TABS: 1.0000 | ORAL_TABLET | Freq: Every day | ORAL | Status: DC

## 2011-06-06 MED ORDER — OXYCODONE-ACETAMINOPHEN 5-325 MG PO TABS
1.0000 | ORAL_TABLET | ORAL | Status: DC | PRN
Start: 2011-06-06 — End: 2011-06-07
  Administered 2011-06-07: 1 via ORAL
  Filled 2011-06-06: qty 1

## 2011-06-06 MED ORDER — B COMPLEX-C PO TABS
1.0000 | ORAL_TABLET | Freq: Every day | ORAL | Status: DC
  Administered 2011-06-06: 1 via ORAL
  Filled 2011-06-06 (×2): qty 1

## 2011-06-06 MED ORDER — LIDOCAINE-EPINEPHRINE 1 %-1:100000 IJ SOLN
INTRAMUSCULAR | Status: DC | PRN
  Administered 2011-06-06: 8 mL

## 2011-06-06 MED ORDER — ACETAMINOPHEN 10 MG/ML IV SOLN
INTRAVENOUS | Status: DC | PRN
  Administered 2011-06-06: 1000 mg via INTRAVENOUS

## 2011-06-06 MED ORDER — BUPIVACAINE HCL (PF) 0.25 % IJ SOLN
INTRAMUSCULAR | Status: DC | PRN
  Administered 2011-06-06: 20 mL

## 2011-06-06 MED ORDER — ACETAMINOPHEN 10 MG/ML IV SOLN
INTRAVENOUS | Status: AC
  Filled 2011-06-06: qty 100

## 2011-06-06 MED ORDER — VANCOMYCIN HCL 1000 MG IV SOLR
500.0000 mg | INTRAVENOUS | Status: DC | PRN
  Administered 2011-06-06: 500 mg via INTRAVENOUS

## 2011-06-06 MED ORDER — FENTANYL CITRATE 0.05 MG/ML IJ SOLN
INTRAMUSCULAR | Status: DC | PRN
  Administered 2011-06-06 (×2): 50 ug via INTRAVENOUS

## 2011-06-06 MED ORDER — PHENOL 1.4 % MT LIQD: 1.0000 | OROMUCOSAL | Status: DC | PRN

## 2011-06-06 MED ORDER — PROGESTERONE MICRONIZED 100 MG PO CAPS
100.0000 mg | ORAL_CAPSULE | Freq: Every day | ORAL | Status: DC
  Filled 2011-06-06 (×2): qty 1

## 2011-06-06 MED ORDER — BACID PO TABS
1.0000 | ORAL_TABLET | Freq: Every day | ORAL | Status: DC
  Administered 2011-06-06: 1 via ORAL
  Filled 2011-06-06 (×2): qty 1

## 2011-06-06 MED ORDER — HEMOSTATIC AGENTS (NO CHARGE) OPTIME
TOPICAL | Status: DC | PRN
  Administered 2011-06-06: 1 via TOPICAL

## 2011-06-06 MED ORDER — HYDROMORPHONE HCL PF 1 MG/ML IJ SOLN: 0.2500 mg | INTRAMUSCULAR | Status: DC | PRN

## 2011-06-06 MED ORDER — CYCLOBENZAPRINE HCL 10 MG PO TABS
10.0000 mg | ORAL_TABLET | Freq: Three times a day (TID) | ORAL | Status: DC | PRN
  Administered 2011-06-07: 10 mg via ORAL
  Filled 2011-06-06: qty 1

## 2011-06-06 MED ORDER — VANCOMYCIN HCL 500 MG IV SOLR
500.0000 mg | Freq: Once | INTRAVENOUS | Status: AC
  Administered 2011-06-07: 500 mg via INTRAVENOUS
  Filled 2011-06-06: qty 500

## 2011-06-06 MED ORDER — ALUM & MAG HYDROXIDE-SIMETH 200-200-20 MG/5ML PO SUSP: 30.0000 mL | Freq: Four times a day (QID) | ORAL | Status: DC | PRN

## 2011-06-06 SURGICAL SUPPLY — 56 items
ADH SKN CLS APL DERMABOND .7 (GAUZE/BANDAGES/DRESSINGS) ×1
APL SKNCLS STERI-STRIP NONHPOA (GAUZE/BANDAGES/DRESSINGS) ×1
BAG DECANTER FOR FLEXI CONT (MISCELLANEOUS) ×2 IMPLANT
BENZOIN TINCTURE PRP APPL 2/3 (GAUZE/BANDAGES/DRESSINGS) ×2 IMPLANT
BLADE SURG 11 STRL SS (BLADE) ×2 IMPLANT
BLADE SURG ROTATE 9660 (MISCELLANEOUS) IMPLANT
BRUSH SCRUB EZ PLAIN DRY (MISCELLANEOUS) ×2 IMPLANT
BUR MATCHSTICK NEURO 3.0 LAGG (BURR) ×2 IMPLANT
BUR PRECISION FLUTE 6.0 (BURR) ×2 IMPLANT
CANISTER SUCTION 2500CC (MISCELLANEOUS) ×2 IMPLANT
CLOTH BEACON ORANGE TIMEOUT ST (SAFETY) ×2 IMPLANT
CONT SPEC 4OZ CLIKSEAL STRL BL (MISCELLANEOUS) ×2 IMPLANT
DECANTER SPIKE VIAL GLASS SM (MISCELLANEOUS) ×1 IMPLANT
DERMABOND ADVANCED (GAUZE/BANDAGES/DRESSINGS) ×1
DERMABOND ADVANCED .7 DNX12 (GAUZE/BANDAGES/DRESSINGS) ×1 IMPLANT
DRAPE LAPAROTOMY 100X72X124 (DRAPES) ×2 IMPLANT
DRAPE MICROSCOPE ZEISS OPMI (DRAPES) ×2 IMPLANT
DRAPE POUCH INSTRU U-SHP 10X18 (DRAPES) ×2 IMPLANT
DRAPE PROXIMA HALF (DRAPES) IMPLANT
DRAPE SURG 17X23 STRL (DRAPES) ×2 IMPLANT
DRSG OPSITE 4X5.5 SM (GAUZE/BANDAGES/DRESSINGS) ×2 IMPLANT
ELECT REM PT RETURN 9FT ADLT (ELECTROSURGICAL) ×2
ELECTRODE REM PT RTRN 9FT ADLT (ELECTROSURGICAL) ×1 IMPLANT
GAUZE SPONGE 4X4 16PLY XRAY LF (GAUZE/BANDAGES/DRESSINGS) IMPLANT
GLOVE BIO SURGEON STRL SZ 6.5 (GLOVE) ×2 IMPLANT
GLOVE BIO SURGEON STRL SZ8 (GLOVE) ×2 IMPLANT
GLOVE BIOGEL PI IND STRL 6.5 (GLOVE) IMPLANT
GLOVE BIOGEL PI INDICATOR 6.5 (GLOVE) ×1
GLOVE ECLIPSE 7.5 STRL STRAW (GLOVE) IMPLANT
GLOVE EXAM NITRILE LRG STRL (GLOVE) IMPLANT
GLOVE EXAM NITRILE MD LF STRL (GLOVE) ×1 IMPLANT
GLOVE EXAM NITRILE XL STR (GLOVE) IMPLANT
GLOVE EXAM NITRILE XS STR PU (GLOVE) IMPLANT
GLOVE INDICATOR 8.5 STRL (GLOVE) ×2 IMPLANT
GOWN BRE IMP SLV AUR LG STRL (GOWN DISPOSABLE) ×2 IMPLANT
GOWN BRE IMP SLV AUR XL STRL (GOWN DISPOSABLE) ×4 IMPLANT
GOWN STRL REIN 2XL LVL4 (GOWN DISPOSABLE) IMPLANT
KIT BASIN OR (CUSTOM PROCEDURE TRAY) ×2 IMPLANT
KIT ROOM TURNOVER OR (KITS) ×2 IMPLANT
NDL SPNL 22GX3.5 QUINCKE BK (NEEDLE) ×1 IMPLANT
NEEDLE HYPO 22GX1.5 SAFETY (NEEDLE) ×2 IMPLANT
NEEDLE SPNL 22GX3.5 QUINCKE BK (NEEDLE) ×2 IMPLANT
NS IRRIG 1000ML POUR BTL (IV SOLUTION) ×2 IMPLANT
PACK LAMINECTOMY NEURO (CUSTOM PROCEDURE TRAY) ×2 IMPLANT
RUBBERBAND STERILE (MISCELLANEOUS) ×4 IMPLANT
SPONGE GAUZE 4X4 12PLY (GAUZE/BANDAGES/DRESSINGS) ×2 IMPLANT
SPONGE SURGIFOAM ABS GEL SZ50 (HEMOSTASIS) ×2 IMPLANT
STRIP CLOSURE SKIN 1/2X4 (GAUZE/BANDAGES/DRESSINGS) ×2 IMPLANT
SUT VIC AB 0 CT1 18XCR BRD8 (SUTURE) ×1 IMPLANT
SUT VIC AB 0 CT1 8-18 (SUTURE) ×2
SUT VIC AB 2-0 CT1 18 (SUTURE) ×2 IMPLANT
SUT VICRYL 4-0 PS2 18IN ABS (SUTURE) ×2 IMPLANT
SYR 20ML ECCENTRIC (SYRINGE) ×2 IMPLANT
TOWEL OR 17X24 6PK STRL BLUE (TOWEL DISPOSABLE) ×2 IMPLANT
TOWEL OR 17X26 10 PK STRL BLUE (TOWEL DISPOSABLE) ×2 IMPLANT
WATER STERILE IRR 1000ML POUR (IV SOLUTION) ×2 IMPLANT

## 2011-06-06 NOTE — Transfer of Care (Signed)
Immediate Anesthesia Transfer of Care Note  Patient: [name]  Procedure(s) Performed: Procedure(s) (LRB): LUMBAR LAMINECTOMY/DECOMPRESSION MICRODISCECTOMY 1 LEVEL (Left)  Patient Location: PACU  Anesthesia Type: General  Level of Consciousness: awake, alert , oriented and patient cooperative  Airway & Oxygen Therapy: Patient Spontanous Breathing and Patient connected to face mask oxygen  Post-op Assessment: Report given to PACU RN  Post vital signs: Reviewed and stable  Complications: No apparent anesthesia complications

## 2011-06-06 NOTE — Anesthesia Procedure Notes (Signed)
Procedure Name: Intubation Date/Time: 06/06/2011 5:24 PM Performed by: Glendora Score A Pre-anesthesia Checklist: Patient identified, Emergency Drugs available, Suction available and Patient being monitored Patient Re-evaluated:Patient Re-evaluated prior to inductionOxygen Delivery Method: Circle system utilized Preoxygenation: Pre-oxygenation with 100% oxygen Intubation Type: IV induction Ventilation: Mask ventilation without difficulty Laryngoscope Size: Miller, 2, Mac and 3 Grade View: Grade I Tube type: Oral Tube size: 7.5 mm Number of attempts: 2 Airway Equipment and Method: Stylet and LTA kit utilized Placement Confirmation: ETT inserted through vocal cords under direct vision,  positive ETCO2 and breath sounds checked- equal and bilateral Secured at: 21 cm Tube secured with: Tape Dental Injury: Teeth and Oropharynx as per pre-operative assessment

## 2011-06-06 NOTE — Anesthesia Preprocedure Evaluation (Signed)
Anesthesia Evaluation  Patient identified by MRN, date of birth, ID band Patient awake    Reviewed: Allergy & Precautions, H&P , NPO status , Patient's Chart, lab work & pertinent test results  History of Anesthesia Complications (+) PONV  Airway Mallampati: II  Neck ROM: full    Dental   Pulmonary former smoker         Cardiovascular     Neuro/Psych    GI/Hepatic   Endo/Other    Renal/GU      Musculoskeletal   Abdominal   Peds  Hematology   Anesthesia Other Findings   Reproductive/Obstetrics                           Anesthesia Physical Anesthesia Plan  ASA: II  Anesthesia Plan: General   Post-op Pain Management:    Induction: Intravenous  Airway Management Planned: Oral ETT  Additional Equipment:   Intra-op Plan:   Post-operative Plan: Extubation in OR  Informed Consent: I have reviewed the patients History and Physical, chart, labs and discussed the procedure including the risks, benefits and alternatives for the proposed anesthesia with the patient or authorized representative who has indicated his/her understanding and acceptance.     Plan Discussed with: CRNA and Surgeon  Anesthesia Plan Comments:         Anesthesia Quick Evaluation

## 2011-06-06 NOTE — Op Note (Signed)
Preoperative diagnosis: Left L5 radiculopathy from lateral recess stenosis lumbar spinal stenosis and a disc bulge at L4-5 left  Postoperative diagnosis: Same  Procedure: Lumbar laminectomy microdiscectomy with foraminotomies of the left L5 nerve root microdissection of the left L5 nerve root microscopic discectomy  Surgeon: Jillyn Hidden Siarah Deleo  Assistant: Hilda Lias  Anesthesia: Gen. Rapide EBL: Minimal  History of present illness: Patient is a 56 year old female whose had unrelenting left buttock and leg pain rating down outside of her left thigh anterior and lateral margins of her left and discomfort consistent with an L5 nerve root pattern acted all forms of conservative treatment physical therapy anti-inflammatories epidural steroid injections and time workup has revealed lateral recess stenosis and displacement of left L5 nerve root so do to progression of clinical syndrome MRI imaging findings of a conservative treatment I recommended lump lumbar laminectomy and discectomy with foraminotomy left L5 nerve root where there is facet of the operation with her she understood and agreed to proceed forward to also an ovary extensively perioperative course and expectations of outcome alternatives of surgery.  Operative procedure: Patient was brought to the or was induced under general anesthesia and positioned prone on the Wilson frame her back was prepped and draped in routine sterile fashion. Elease Hashimoto localize the proper levels after infiltration of 8 cc lidocaine with epi a midline incision was made and Bovie left R. was used taken the subcutaneous tissues and subperiosteal dissection carried lamina of L4 and L5. Her operative eccentricity point to the L3-4 disc space another x-ray was taken one disc as well as to confirm L4-5 and then the inferior aspect of L4 medial facet complexes suppressant of 5 was drilled down high-speed drill laminotomy was begun with a 2 and a Kerrison punch was identified  remarkably upper to feet as was removed in piecemeal fashion with 2 Kerrison punch exposing the dorsal thecal sac. He is point the operating maxilla was draped and brought into the field and the microscope illumination L5 pedicle was identified and the L5 nerve was identified there was a large osteophyte coming off the medial facet complexes placed a proximal L5 nerve root level pedicle to the level just above the pedicle this was teased off the nerve root and removed in piecemeal fashion with 20 mm in punch unroofing the L5 foramen flush with pedicle. Working back into the disc space epidural veins are coagulated and disc spaces inspected it was felt to be bulging irritating the ventral surface of the L5 nerve root so annulotomy was made with 11 blade scalpel disc spaces cleanout.  Central and lateral disc was cleaned out decompressing the thecal sac. Then the L4 pedicle was palpated the L4 foramen was palpated to confirm patency there is no lateral disc palpated with the ER coronary.oh angled hockey-stick out beyond the facet joint and the disc space pulse centrally and laterally was collapsed. The L5 foramen was also reinspected with a coronary.A. Hunter to confirm decompression the wounds and copiously irrigated meticulous hemostasis was maintained Gelfoam was laid over the dura muscle fascia reapproximated layers with after Vicryl was) 4 subcuticular and sensation applied patient recovered in stable condition at the end of case on it and sponge counts were correct per the nurses.

## 2011-06-06 NOTE — Anesthesia Postprocedure Evaluation (Signed)
  Anesthesia Post-op Note  Patient: [name]  Procedure(s) Performed: Procedure(s) (LRB): LUMBAR LAMINECTOMY/DECOMPRESSION MICRODISCECTOMY 1 LEVEL (Left)  Patient Location: PACU  Anesthesia Type: General  Level of Consciousness: awake, alert  and oriented  Airway and Oxygen Therapy: Patient Spontanous Breathing  Post-op Pain: mild  Post-op Assessment: Post-op Vital signs reviewed, Patient's Cardiovascular Status Stable, Respiratory Function Stable, Patent Airway, No signs of Nausea or vomiting and Pain level controlled  Post-op Vital Signs: Reviewed and stable  Complications: No apparent anesthesia complications

## 2011-06-06 NOTE — Progress Notes (Signed)
Pt oob to void, amb with sba, tol/well

## 2011-06-06 NOTE — Preoperative (Signed)
Beta Blockers   Reason not to administer Beta Blockers:Not Applicable 

## 2011-06-06 NOTE — H&P (Signed)
[name] is an 56 y.o. female.   Chief Complaint: Back and left leg pain HPI: 55 to be presents with persistent left buttock and leg pain rate and the outside of her thigh the front of her shin cage and the top for consistent with an L5 nerve root pattern. The refractory to all forms of conservative treatment with anti-inflammatories physical therapy epidural steroid injections and time she's undergone a both MRI scan and CT myelography which shows lateral recess stenosis a disc bulge eccentric to the left and compression both proximal 5 root and maybe some irritation the undersurface of the forward. Due to her failure conservative treatment progression of clinical syndrome and I recommended a decompressive laminotomy and discectomy at this level of the wrist as of the operation with her she understands and agrees to proceed forward avulsed explain the risks and benefits perioperative course and expectations of outcome alternatives to surgery.  Past Medical History  Diagnosis Date  . PONV (postoperative nausea and vomiting)   . Chronic back pain     buldging disc    Past Surgical History  Procedure Date  . Cervical spine surgery 2012  . Melanoma excision earl;y 2000's    from left left leg  . Colonoscopy 2004  . Esophagogastroduodenoscopy     Family History  Problem Relation Age of Onset  . Anesthesia problems Neg Hx   . Hypotension Neg Hx   . Malignant hyperthermia Neg Hx   . Pseudochol deficiency Neg Hx    Social History:  reports that she quit smoking about 32 years ago. She does not have any smokeless tobacco history on file. She reports that she drinks alcohol. She reports that she does not use illicit drugs.  Allergies:  Allergies  Allergen Reactions  . Thimerosal Other (See Comments)    CLouded vision of eyes.  Marland Kitchen Penicillins Hives    Medications Prior to Admission  Medication Dose Route Frequency Provider Last Rate Last Dose  . acetaminophen (OFIRMEV) 10 MG/ML IV            . bacitracin 16109 UNITS injection           . dexamethasone (DECADRON) injection 10 mg  10 mg Intravenous Once Deborah Dollar, MD      . HYDROmorphone (DILAUDID) injection 0.25-0.5 mg  0.25-0.5 mg Intravenous Q5 min PRN Deborah Simmonds, MD      . ondansetron [facility]) injection 4 mg  4 mg Intravenous Q6H PRN Deborah Simmonds, MD      . sodium chloride 0.9 % infusion           . vancomycin (VANCOCIN) 500 mg in sodium chloride 0.9 % 100 mL IVPB  500 mg Intravenous Once Deborah Dollar, MD       Medications Prior to Admission  Medication Sig Dispense Refill  . lactobacillus acidophilus (BACID) TABS Take 1 tablet by mouth daily.        Results for orders placed during the hospital encounter of 06/05/11 (from the past 48 hour(s))  SURGICAL PCR SCREEN     Status: Normal   Collection Time   06/05/11 10:56 AM      Component Value Range Comment   MRSA, PCR NEGATIVE  NEGATIVE     Staphylococcus aureus NEGATIVE  NEGATIVE    CBC     Status: Normal   Collection Time   06/05/11 10:57 AM      Component Value Range Comment   WBC 6.4  4.0 -  10.5 (K/uL)    RBC 4.21  3.87 - 5.11 (MIL/uL)    Hemoglobin 13.9  12.0 - 15.0 (g/dL)    HCT 45.4  09.8 - 11.9 (%)    MCV 93.6  78.0 - 100.0 (fL)    MCH 33.0  26.0 - 34.0 (pg)    MCHC 35.3  30.0 - 36.0 (g/dL)    RDW 14.7  82.9 - 56.2 (%)    Platelets 268  150 - 400 (K/uL)    No results found.  Review of Systems  Constitutional: Negative.   HENT: Negative.   Eyes: Negative.   Respiratory: Negative.   Cardiovascular: Negative.   Gastrointestinal: Negative.   Genitourinary: Negative.   Musculoskeletal: Positive for myalgias and back pain.  Skin: Negative.   Neurological: Positive for tingling.    Blood pressure 112/82, pulse 83, temperature 98.3 F (36.8 C), temperature source Oral, resp. rate 18, SpO2 100.00%. Physical Exam  Constitutional: She is oriented to person, place, and time. She appears well-developed and well-nourished.  HENT:  Head:  Normocephalic.  Eyes: Pupils are equal, round, and reactive to light.  Neck: Normal range of motion.  Respiratory: Effort normal.  GI: Soft. Bowel sounds are normal.  Neurological: She is alert and oriented to person, place, and time. She has normal strength. GCS eye subscore is 4. GCS verbal subscore is 5. GCS motor subscore is 6.  Reflex Scores:      Patellar reflexes are 0 on the right side and 0 on the left side.      Achilles reflexes are 0 on the right side and 0 on the left side.      Patient has 5 out of 5 strength in her iliopsoas, quads, hip she's, gastrocs, and EHL.     Assessment/Plan 56 year old female presents for an L4-5 left-sided laminectomy and discectomy.  [name] P 06/06/2011, 5:10 PM

## 2011-06-07 ENCOUNTER — Encounter (HOSPITAL_COMMUNITY): Payer: Self-pay | Admitting: Neurosurgery

## 2011-06-07 MED ORDER — CYCLOBENZAPRINE HCL 10 MG PO TABS: 10.0000 mg | ORAL_TABLET | Freq: Three times a day (TID) | ORAL | Status: AC | PRN

## 2011-06-07 NOTE — Discharge Summary (Signed)
  Physician Discharge Summary  Patient ID: [name] MRN: [id number] DOB/AGE: 55-May-1957 55 y.o.  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 *    Discharged Condition: good  Hospital Course: Patient was admitted hospital underwent a left L4-5 laminectomy and discectomy postoperatively are well with recovered in the floor on the floor was angling and voiding spontaneously tolerating regular diet was to be discharged on postoperative day 1.  Consults: Significant Diagnostic Studies: Treatments: L4-5 laminectomy on the left Discharge Exam: Blood pressure 103/60, pulse 82, temperature 97.8 F (36.6 C), temperature source Oral, resp. rate 14, height 5\' 2"  (1.575 m), weight 51.256 kg (113 lb), SpO2 98.00%. Strength out of 5 wound clean and dry  Disposition: Home   Medication List  As of 06/07/2011  7:37 AM   TAKE these medications         acetaminophen 500 MG tablet   Commonly known as: TYLENOL   Take 1,000 mg by mouth every 6 (six) hours as needed.      B-complex with vitamin C tablet   Take 1 tablet by mouth daily.      Calcium-Vitamin D 600-200 MG-UNIT per tablet   Take 1 tablet by mouth daily.      cholecalciferol 1000 UNITS tablet   Commonly known as: VITAMIN D   Take 2,000 Units by mouth daily.      cyclobenzaprine 10 MG tablet   Commonly known as: FLEXERIL   Take 1 tablet (10 mg total) by mouth 3 (three) times daily as needed for muscle spasms.      estradiol 0.05 MG/24HR   Commonly known as: VIVELLE-DOT   Place 1 patch onto the skin 2 (two) times a week.      Fish Oil Oil   Take 5 mLs by mouth daily.      lactobacillus acidophilus Tabs   Take 1 tablet by mouth daily.      multivitamins ther. w/minerals Tabs   Take 1 tablet by mouth daily.      progesterone 100 MG capsule   Commonly known  as: PROMETRIUM   Take 100 mg by mouth daily.             Signed: Carnell Beavers P 06/07/2011, 7:37 AM

## 2011-06-07 NOTE — Progress Notes (Signed)
Subjective: Patient reports No leg pain minimal back stiffness angling and voiding spontaneously  Objective: Vital signs in last 24 hours: Temp:  [97.8 F (36.6 C)-99 F (37.2 C)] 97.8 F (36.6 C) (03/27 0400) Pulse Rate:  [74-99] 82  (03/27 0400) Resp:  [14-22] 14  (03/27 0400) BP: (103-137)/(60-84) 103/60 mmHg (03/27 0400) SpO2:  [98 %-100 %] 98 % (03/27 0400) Weight:  [51.256 kg (113 lb)] 51.256 kg (113 lb) (03/26 1945)  Intake/Output from previous day: 03/26 0701 - 03/27 0700 In: 1790 [P.O.:290; I.V.:1500] Out: -  Intake/Output this shift:    Strength out of 5 wound clean and dry the  Lab Results:  Basename 06/05/11 1057  WBC 6.4  HGB 13.9  HCT 39.4  PLT 268   BMET No results found for this basename: NA:2,K:2,CL:2,CO2:2,GLUCOSE:2,BUN:2,CREATININE:2,CALCIUM:2 in the last 72 hours  Studies/Results: Dg Lumbar Spine 2-3 Views  06/06/2011  *RADIOLOGY REPORT*  Clinical Data: 56 year old female - L4-L5 discectomy for left L5 radiculopathy due to disc bulge, lateral recess and spinal stenosis.  LUMBAR SPINE - 2-3 VIEW  Comparison: 05/08/2011 radiographs  Findings: Three intraoperative lateral views of the lumbar spine are submitted postoperatively for interpretation. Film #1 demonstrates a posterior localizer directed at the L4 spinous process. Film #2 demonstrates posterior metallic hardware directed at the L4 vertebral body. Film #3 demonstrates posterior metallic hardware directed at the L4- L5 interspace.  IMPRESSION: Localizers.  Original Report Authenticated By: Rosendo Gros, M.D.    Assessment/Plan:  postoperative day 1 from an L4-5 laminectomy doing very well we'll plan discharge  LOS: 1 day     Avianna Moynahan P 06/07/2011, 7:35 AM

## 2011-06-07 NOTE — Discharge Instructions (Signed)
Wound Care  Keep incision area clean and dry  If you shower , cover incision with plastic wrap.  Do not put any creams, lotions, or ointments on incision. Leave steri-strips on back.  They will fall off by themselves. Activity Walk each and every day, increasing distance each day. No lifting greater than 5 lbs.  Avoid bending, arching, or twisting. No driving for 2 weeks; may ride as a passenger locally. No lifting no bending no twisting no driving a riding a car for a week. If provided with back brace, wear when out of bed.  It is not necessary to wear in bed. Diet Resume your normal diet.  Return to Work Will be discussed at you follow up appointment. Call Your Doctor If Any of These Occur Redness, drainage, or swelling at the wound.  Temperature greater than 101 degrees. Severe pain not relieved by pain medication. Incision starts to come apart. Follow Up Appt Call today for appointment in 1-2 weeks ([phone]) or for problems.  If you have any hardware placed in your spine, you will need an x-ray before your appointment.

## 2012-03-25 ENCOUNTER — Other Ambulatory Visit: Payer: Self-pay | Admitting: Obstetrics

## 2012-03-25 DIAGNOSIS — Z1231 Encounter for screening mammogram for malignant neoplasm of breast: Secondary | ICD-10-CM

## 2012-04-16 ENCOUNTER — Ambulatory Visit
Admission: RE | Admit: 2012-04-16 | Discharge: 2012-04-16 | Disposition: A | Discharge status: Home / Self Care | Payer: Managed Care, Other (non HMO) | Source: Ambulatory Visit | Attending: Obstetrics | Admitting: Obstetrics

## 2012-04-16 DIAGNOSIS — Z1231 Encounter for screening mammogram for malignant neoplasm of breast: Secondary | ICD-10-CM

## 2012-09-11 ENCOUNTER — Other Ambulatory Visit: Payer: Self-pay | Admitting: Dermatology

## 2013-03-18 ENCOUNTER — Other Ambulatory Visit: Payer: Self-pay

## 2013-03-18 DIAGNOSIS — Z1231 Encounter for screening mammogram for malignant neoplasm of breast: Secondary | ICD-10-CM

## 2013-04-17 ENCOUNTER — Ambulatory Visit
Admission: RE | Admit: 2013-04-17 | Discharge: 2013-04-17 | Disposition: A | Discharge status: Home / Self Care | Payer: Self-pay | Source: Ambulatory Visit

## 2013-04-17 DIAGNOSIS — Z1231 Encounter for screening mammogram for malignant neoplasm of breast: Secondary | ICD-10-CM

## 2013-09-24 ENCOUNTER — Other Ambulatory Visit: Payer: Self-pay | Admitting: Dermatology

## 2013-12-01 ENCOUNTER — Encounter: Payer: Self-pay | Admitting: *Deleted

## 2013-12-02 ENCOUNTER — Encounter: Payer: Self-pay | Admitting: Neurology

## 2013-12-02 ENCOUNTER — Ambulatory Visit (INDEPENDENT_AMBULATORY_CARE_PROVIDER_SITE_OTHER): Payer: Managed Care, Other (non HMO) | Admitting: Neurology

## 2013-12-02 VITALS — BP 133/81 | HR 88 | Ht 62.0 in | Wt 111.0 lb

## 2013-12-02 DIAGNOSIS — M5430 Sciatica, unspecified side: Secondary | ICD-10-CM

## 2013-12-02 DIAGNOSIS — M5432 Sciatica, left side: Secondary | ICD-10-CM

## 2013-12-02 HISTORY — DX: Sciatica, left side: M54.32

## 2013-12-02 NOTE — Progress Notes (Signed)
Reason for visit: Back pain, leg pain  [name] is a 58 y.o. female  History of present illness:  [name] is a 58 year old left-handed white female with a history of chronic low back pain. The patient underwent cervical spine surgery in 2012 with bilateral upper extremity discomfort and left leg discomfort that came on together. She underwent surgery at the C4-5 and C5-6 levels. The surgery improved the upper extremity discomfort. Dr. Saintclair Halsted was the neurosurgeon involved. The left leg pain continued after the surgery, however. The patient underwent a lumbosacral surgery at the L4-5 level on the left. The patient however did not improve with the left leg pain following surgery. MRI of the lumbosacral spine done in October 2014 and showed some evidence of scar tissue around the left L5 nerve root. There was a disc bulge at the L3-4 level with a small annular tear. The patient has continued to have left leg discomfort that begins in the back, goes into the left hip, and down the leg laterally and into the left foot. The patient may have numbness and tingling sensations all the way into the toes of the left foot. The patient has occasionally will have muscle cramps in the legs at night. She thinks that there may be some slight weakness in the legs. Within the last month or so, the patient has noted some sharp pains going down the right leg as well that may last a week or more. The patient has had 2 bouts of pain down the right leg. The patient may have some discomfort into the left hip as well. She is engaged in yoga which seemed to help some. The patient does lumbar traction without much benefit. The patient has been seen by a chiropractor, and she has undergone laser treatments, and dry needling without benefit. The patient is followed by Dr. Nelva Bush, and epidural steroid injections of the L5 nerve root and facet joint injections have not been beneficial. The patient is sent to this office for an  evaluation. She denies any problems controlling the bowels or the bladder, or any significant balance issues. She indicates that she feels best when she is standing, prolonged sitting or prolonged walking is detrimental for her. She feels best in the morning.  Past Medical History  Diagnosis Date  . PONV (postoperative nausea and vomiting)   . Chronic back pain     buldging disc  . Sciatica of left side 12/02/2013    Past Surgical History  Procedure Laterality Date  . Cervical spine surgery  2012  . Melanoma excision  earl;y 2000's    from left left leg  . Colonoscopy  2004  . Esophagogastroduodenoscopy    . Lumbar laminectomy/decompression microdiscectomy  06/06/2011    Procedure: LUMBAR LAMINECTOMY/DECOMPRESSION MICRODISCECTOMY 1 LEVEL;  Surgeon: Elaina Hoops, MD;  Location: [facility];  Service: Neurosurgery;  Laterality: Left;  Left Lumbar four-five laminectomy/microdisectomy    Family History  Problem Relation Age of Onset  . Anesthesia problems Neg Hx   . Hypotension Neg Hx   . Malignant hyperthermia Neg Hx   . Pseudochol deficiency Neg Hx   . Heart Problems Mother   . Hypertension Mother   . Cancer Father   . Emphysema Father     Social history:  reports that she quit smoking about 34 years ago. She has never used smokeless tobacco. She reports that she drinks alcohol. She reports that she does not use illicit drugs.  Medications:  Current  Outpatient Prescriptions on File Prior to Visit  Medication Sig Dispense Refill  . acetaminophen (TYLENOL) 500 MG tablet Take 1,000 mg by mouth every 6 (six) hours as needed.      . B Complex-C (B-COMPLEX WITH VITAMIN C) tablet Take 1 tablet by mouth daily.      . Calcium-Vitamin D 600-200 MG-UNIT per tablet Take 1 tablet by mouth daily.      . cholecalciferol (VITAMIN D) 1000 UNITS tablet Take 2,000 Units by mouth daily.      Marland Kitchen estradiol (VIVELLE-DOT) 0.05 MG/24HR Place 1 patch onto the skin 2 (two) times a week.      . Fish Oil  OIL Take 5 mLs by mouth daily.      Marland Kitchen lactobacillus acidophilus (BACID) TABS Take 1 tablet by mouth daily.      . Multiple Vitamins-Minerals (MULTIVITAMINS THER. W/MINERALS) TABS Take 1 tablet by mouth daily.      . progesterone (PROMETRIUM) 100 MG capsule Take 100 mg by mouth daily.       No current facility-administered medications on file prior to visit.      Allergies  Allergen Reactions  . Thimerosal Other (See Comments)    CLouded vision of eyes.  Marland Kitchen Penicillins Hives    ROS:  Out of a complete 14 system review of symptoms, the patient complains only of the following symptoms, and all other reviewed systems are negative.  Joint pain, muscle cramps, achy muscles Numbness in the legs  Blood pressure 133/81, pulse 88, height 5\' 2"  (1.575 m), weight 111 lb (50.349 kg).  Physical Exam  General: The patient is alert and cooperative at the time of the examination.  Eyes: Pupils are equal, round, and reactive to light. Discs are flat bilaterally.  Neck: The neck is supple, no carotid bruits are noted.  Respiratory: The respiratory examination is clear.  Cardiovascular: The cardiovascular examination reveals a regular rate and rhythm, no obvious murmurs or rubs are noted.  Neuromuscular: Range of movement of the lumbosacral spine is full. With rotation of the hips, minimal discomfort is noted.  Skin: Extremities are without significant edema.  Neurologic Exam  Mental status: The patient is alert and oriented x 3 at the time of the examination. The patient has apparent normal recent and remote memory, with an apparently normal attention span and concentration ability.  Cranial nerves: Facial symmetry is present. There is good sensation of the face to pinprick and soft touch bilaterally. The strength of the facial muscles and the muscles to head turning and shoulder shrug are normal bilaterally. Speech is well enunciated, no aphasia or dysarthria is noted. Extraocular movements  are full. Visual fields are full. The tongue is midline, and the patient has symmetric elevation of the soft palate. No obvious hearing deficits are noted.  Motor: The motor testing reveals 5 over 5 strength of all 4 extremities. Good symmetric motor tone is noted throughout.  Sensory: Sensory testing is intact to pinprick, soft touch, vibration sensation, and position sense on all 4 extremities. No evidence of extinction is noted.  Coordination: Cerebellar testing reveals good finger-nose-finger and heel-to-shin bilaterally.  Gait and station: Gait is normal. Tandem gait is normal. Romberg is negative. No drift is seen. The patient is able walk on heels and the toes bilaterally.  Reflexes: Deep tendon reflexes are symmetric and normal bilaterally. Toes are downgoing bilaterally.   Assessment/Plan:  1. Chronic low back pain, left greater than right leg discomfort  Clinically, the patient has a completely  normal examination. There is good strength, sensation, and symmetric reflexes. The patient has undergone multiple therapies for her low back pain without benefit. MRI of the back does suggest some scar tissue around the L5 nerve root, but there is also some mild degeneration of the left SI joint. The patient will be set up for nerve conduction studies of both legs, EMG evaluation of both legs. If these studies are normal, I would recommend a trial with a SI joint injection on the left. The patient will followup for the EMG evaluation. The patient recently has been started on amitriptyline, and I think that this therapy is reasonable at this time. The patient has been considered for a spinal stimulator, but the patient is hesitant to undergo this procedure.  Oyindamola Alexanders MD 12/02/2013 8:02 PM  [facility] [address] [place], [place] [address]  Phone [phone] Fax [phone]

## 2013-12-02 NOTE — Patient Instructions (Signed)
Back Pain, Adult °Back pain is very common. The pain often gets better over time. The cause of back pain is usually not dangerous. Most people can learn to manage their back pain on their own.  °HOME CARE  °· Stay active. Start with short walks on flat ground if you can. Try to walk farther each day. °· Do not sit, drive, or stand in one place for more than 30 minutes. Do not stay in bed. °· Do not avoid exercise or work. Activity can help your back heal faster. °· Be careful when you bend or lift an object. Bend at your knees, keep the object close to you, and do not twist. °· Sleep on a firm mattress. Lie on your side, and bend your knees. If you lie on your back, put a pillow under your knees. °· Only take medicines as told by your doctor. °· Put ice on the injured area. °¨ Put ice in a plastic bag. °¨ Place a towel between your skin and the bag. °¨ Leave the ice on for 15-20 minutes, 03-04 times a day for the first 2 to 3 days. After that, you can switch between ice and heat packs. °· Ask your doctor about back exercises or massage. °· Avoid feeling anxious or stressed. Find good ways to deal with stress, such as exercise. °GET HELP RIGHT AWAY IF:  °· Your pain does not go away with rest or medicine. °· Your pain does not go away in 1 week. °· You have new problems. °· You do not feel well. °· The pain spreads into your legs. °· You cannot control when you poop (bowel movement) or pee (urinate). °· Your arms or legs feel weak or lose feeling (numbness). °· You feel sick to your stomach (nauseous) or throw up (vomit). °· You have belly (abdominal) pain. °· You feel like you may pass out (faint). °MAKE SURE YOU:  °· Understand these instructions. °· Will watch your condition. °· Will get help right away if you are not doing well or get worse. °Document Released: 08/16/2007 Document Revised: 05/22/2011 Document Reviewed: 07/01/2013 °[employer]® Patient Information ©2015 [employer]. This information is not intended  to replace advice given to you by your health care provider. Make sure you discuss any questions you have with your health care provider. ° °

## 2013-12-04 ENCOUNTER — Encounter (INDEPENDENT_AMBULATORY_CARE_PROVIDER_SITE_OTHER): Payer: Self-pay

## 2013-12-04 ENCOUNTER — Ambulatory Visit (INDEPENDENT_AMBULATORY_CARE_PROVIDER_SITE_OTHER): Payer: Managed Care, Other (non HMO) | Admitting: Neurology

## 2013-12-04 DIAGNOSIS — Z0289 Encounter for other administrative examinations: Secondary | ICD-10-CM

## 2013-12-04 DIAGNOSIS — M5432 Sciatica, left side: Secondary | ICD-10-CM

## 2013-12-04 DIAGNOSIS — M5430 Sciatica, unspecified side: Secondary | ICD-10-CM

## 2013-12-04 NOTE — Procedures (Signed)
     HISTORY:  [name] is a 58 year old patient with a history of chronic lower back discomfort and left greater than right lower extremity discomfort. The patient is being evaluated for a possible neuropathy or a lumbosacral radiculopathy.  NERVE CONDUCTION STUDIES:  Nerve conduction studies were performed on both lower extremities. The distal motor latencies and motor amplitudes for the peroneal and posterior tibial nerves were within normal limits. The nerve conduction velocities for these nerves were also normal. The H reflex latencies were normal. The sensory latencies for the peroneal nerves were within normal limits.   EMG STUDIES:  EMG study was performed on the left lower extremity:  The tibialis anterior muscle reveals 2 to 4K motor units with full recruitment. No fibrillations or positive waves were seen. The peroneus tertius muscle reveals 2 to 4K motor units with full recruitment. No fibrillations or positive waves were seen. The medial gastrocnemius muscle reveals 1 to 3K motor units with full recruitment. No fibrillations or positive waves were seen. The vastus lateralis muscle reveals 2 to 4K motor units with full recruitment. No fibrillations or positive waves were seen. The iliopsoas muscle reveals 2 to 4K motor units with full recruitment. No fibrillations or positive waves were seen. The biceps femoris muscle (long head) reveals 2 to 4K motor units with full recruitment. No fibrillations or positive waves were seen. The lumbosacral paraspinal muscles were tested at 3 levels, and revealed no abnormalities of insertional activity at all 3 levels tested. There was good relaxation.   IMPRESSION:  Nerve conduction studies done on both lower extremities were within normal limits. No evidence of a peripheral neuropathy is seen. EMG evaluation of the left lower extremity was normal, without evidence of an overlying lumbosacral radiculopathy.  Annamary Alexanders  MD 12/04/2013 4:40 PM  [facility] [address] [place], [place] [address]  Phone [phone] Fax [phone]

## 2013-12-04 NOTE — Progress Notes (Signed)
[name] is a 58 year old patient with a history of chronic low back pain and bilateral leg discomfort, left greater than right. The patient returns today for EMG and nerve conduction study to evaluate her for the pain.  Nerve conduction studies on both legs were normal, EMG evaluation of the left leg was normal.  The patient has no evidence of an acute or chronic radiculopathy on the left. The patient does have sciatica type pain down the left leg. The patient is followed by Dr. Nelva Bush, and I would wonder whether at this point a left SI joint injection may not be of some benefit. If the injection can be done, and the patient improves even transiently with her left leg pain, this may be an indication at and SI joint dysfunction problem is the source of her pain. The patient is on amitriptyline, she will continue this for now.

## 2013-12-10 ENCOUNTER — Other Ambulatory Visit: Payer: Self-pay | Admitting: Physical Medicine and Rehabilitation

## 2013-12-10 DIAGNOSIS — G8929 Other chronic pain: Secondary | ICD-10-CM

## 2013-12-10 DIAGNOSIS — M533 Sacrococcygeal disorders, not elsewhere classified: Principal | ICD-10-CM

## 2013-12-16 ENCOUNTER — Ambulatory Visit
Admission: RE | Admit: 2013-12-16 | Discharge: 2013-12-16 | Disposition: A | Discharge status: Home / Self Care | Payer: Managed Care, Other (non HMO) | Source: Ambulatory Visit | Attending: Physical Medicine and Rehabilitation | Admitting: Physical Medicine and Rehabilitation

## 2013-12-16 VITALS — BP 148/71 | HR 87

## 2013-12-16 DIAGNOSIS — M5432 Sciatica, left side: Secondary | ICD-10-CM

## 2013-12-16 DIAGNOSIS — Z85820 Personal history of malignant melanoma of skin: Secondary | ICD-10-CM

## 2013-12-16 DIAGNOSIS — Z85828 Personal history of other malignant neoplasm of skin: Secondary | ICD-10-CM

## 2013-12-16 DIAGNOSIS — M533 Sacrococcygeal disorders, not elsewhere classified: Principal | ICD-10-CM

## 2013-12-16 DIAGNOSIS — G8929 Other chronic pain: Secondary | ICD-10-CM

## 2013-12-16 MED ORDER — IOHEXOL 180 MG/ML  SOLN
1.0000 mL | Freq: Once | INTRAMUSCULAR | Status: AC | PRN
  Administered 2013-12-16: 1 mL via INTRA_ARTICULAR

## 2013-12-16 MED ORDER — METHYLPREDNISOLONE ACETATE 40 MG/ML INJ SUSP (RADIOLOG
120.0000 mg | Freq: Once | INTRAMUSCULAR | Status: AC
  Administered 2013-12-16: 120 mg via INTRA_ARTICULAR

## 2013-12-16 NOTE — Discharge Instructions (Signed)

## 2014-01-31 IMAGING — CR DG LUMBAR SPINE 2-3V
3 series · 3 of 3 positions shown · non-contrast
Comparison: MR lumbar spine of 12/31/2010

CLINICAL DATA: Low back and left leg pain, no injury

LUMBAR SPINE - 2-3 VIEW

[w l-spine lat * (1 of 3)]
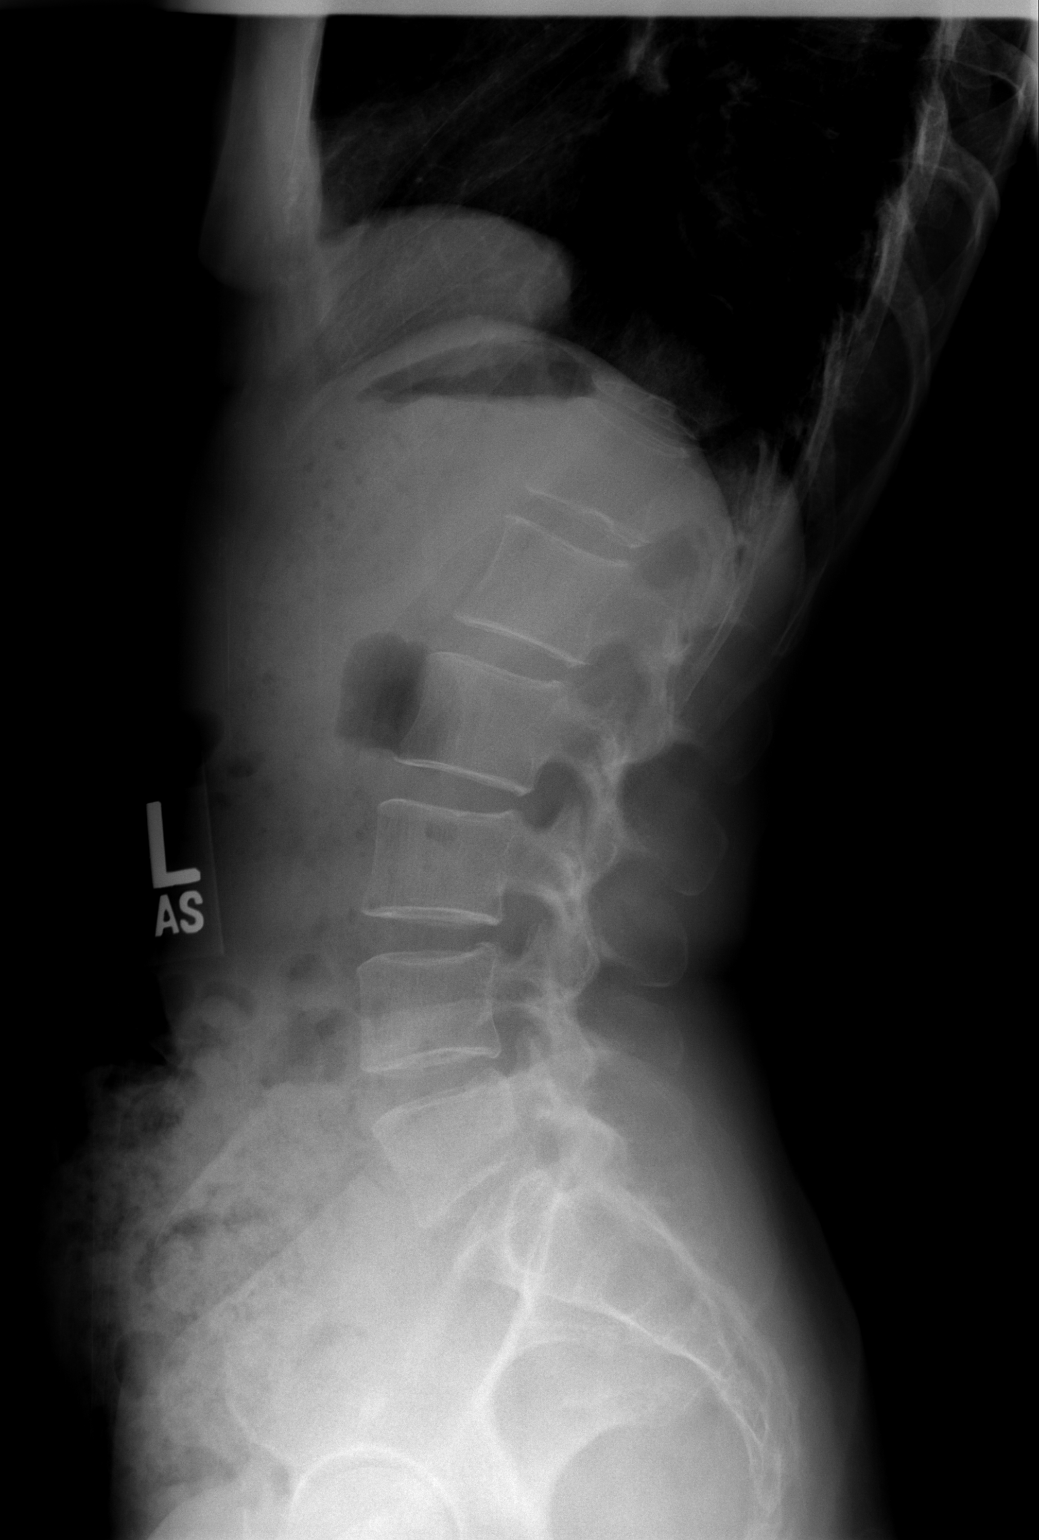

[w l-spine lat * (2 of 3)]
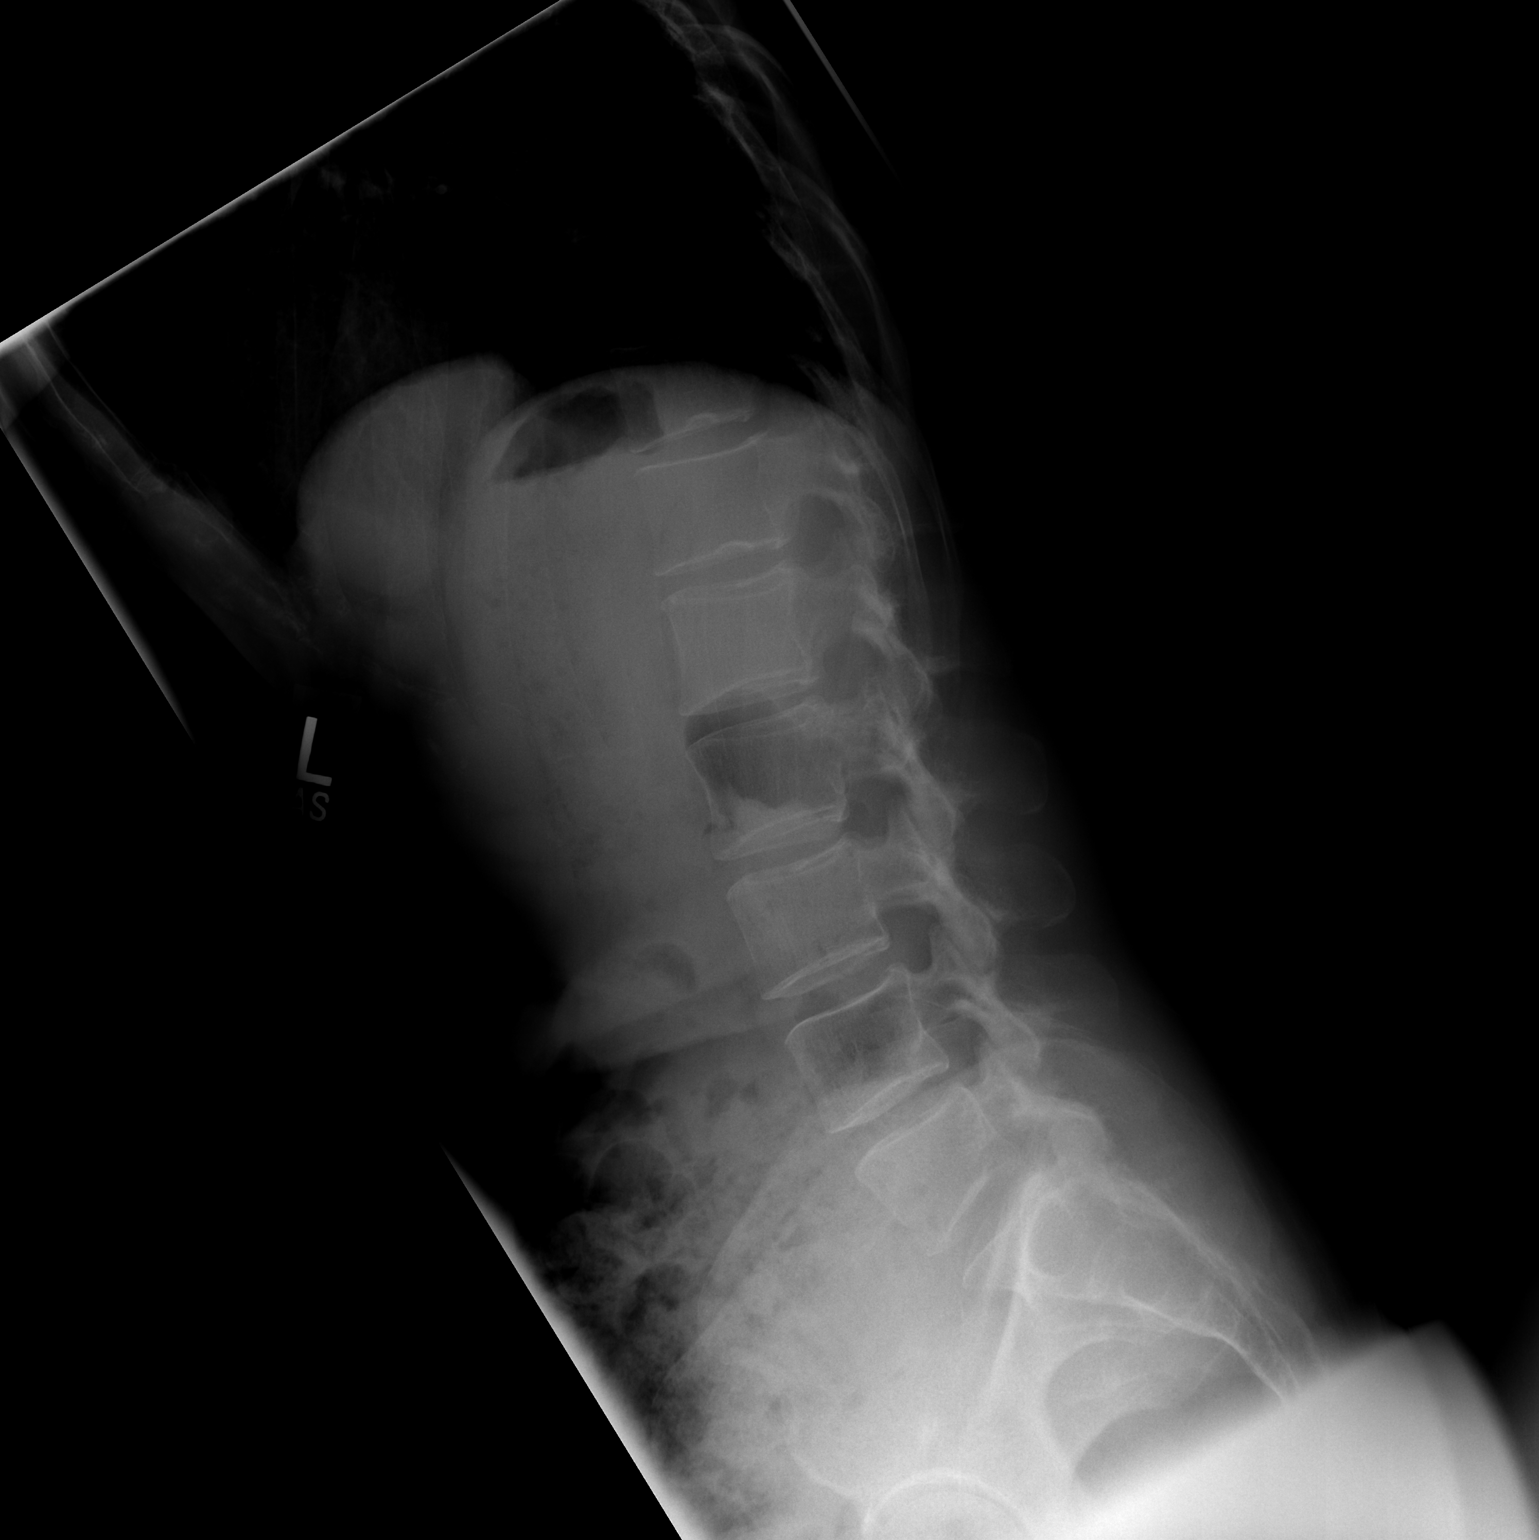

[w l-spine lat * (3 of 3)]
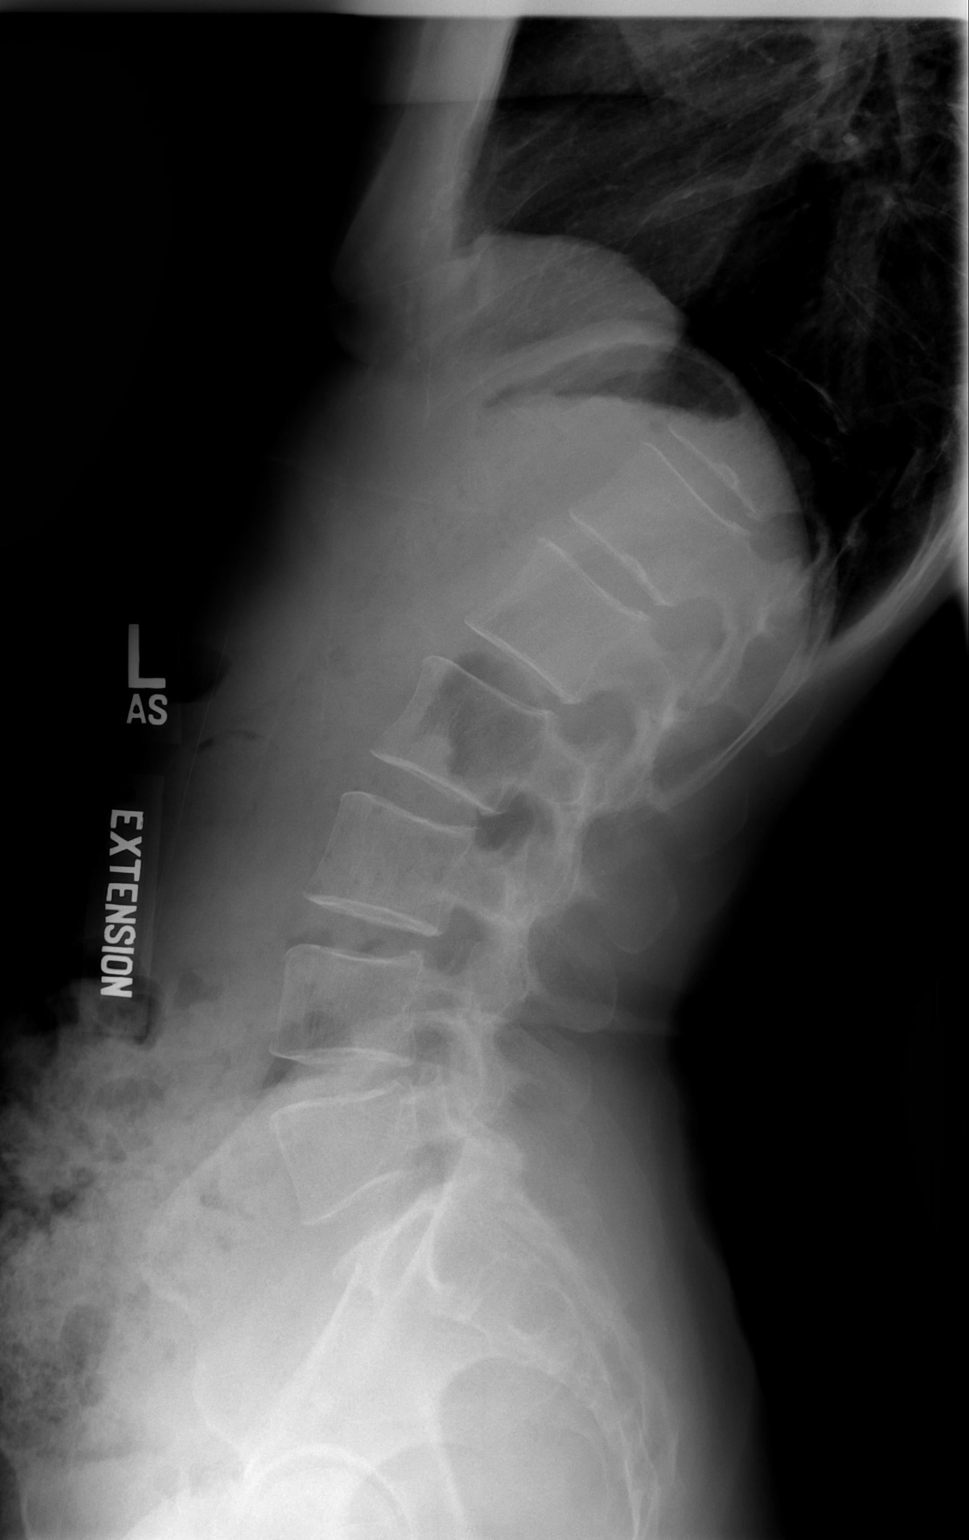

[3 of 3 positions shown; findings below may reference images not displayed]

FINDINGS: In the neutral lateral view, the lumbar vertebrae are in
normal alignment.  Intervertebral disc spaces appear normal.
Through flexion and extension there is slightly limited range of
motion with no malalignment.
IMPRESSION: 1.  Normal alignment.  Normal disc spaces.
2.  Slightly limited range of motion.

## 2014-03-20 ENCOUNTER — Other Ambulatory Visit: Payer: Self-pay

## 2014-03-20 DIAGNOSIS — Z1231 Encounter for screening mammogram for malignant neoplasm of breast: Secondary | ICD-10-CM

## 2014-04-21 ENCOUNTER — Ambulatory Visit
Admission: RE | Admit: 2014-04-21 | Discharge: 2014-04-21 | Disposition: A | Discharge status: Home / Self Care | Payer: 59 | Source: Ambulatory Visit

## 2014-04-21 DIAGNOSIS — Z1231 Encounter for screening mammogram for malignant neoplasm of breast: Secondary | ICD-10-CM

## 2014-06-24 ENCOUNTER — Other Ambulatory Visit: Payer: Self-pay | Admitting: Dermatology

## 2015-03-31 ENCOUNTER — Other Ambulatory Visit: Payer: Self-pay

## 2015-03-31 DIAGNOSIS — Z1231 Encounter for screening mammogram for malignant neoplasm of breast: Secondary | ICD-10-CM

## 2015-04-23 ENCOUNTER — Ambulatory Visit
Admission: RE | Admit: 2015-04-23 | Discharge: 2015-04-23 | Disposition: A | Discharge status: Home / Self Care | Payer: 59 | Source: Ambulatory Visit

## 2015-04-23 DIAGNOSIS — Z1231 Encounter for screening mammogram for malignant neoplasm of breast: Secondary | ICD-10-CM

## 2016-03-28 ENCOUNTER — Other Ambulatory Visit: Payer: Self-pay | Admitting: Obstetrics

## 2016-03-28 DIAGNOSIS — Z1231 Encounter for screening mammogram for malignant neoplasm of breast: Secondary | ICD-10-CM

## 2016-04-14 ENCOUNTER — Other Ambulatory Visit: Payer: Self-pay | Admitting: Obstetrics

## 2016-04-14 DIAGNOSIS — E2839 Other primary ovarian failure: Secondary | ICD-10-CM

## 2016-04-27 ENCOUNTER — Ambulatory Visit
Admission: RE | Admit: 2016-04-27 | Discharge: 2016-04-27 | Disposition: A | Discharge status: Home / Self Care | Payer: 59 | Source: Ambulatory Visit | Attending: Obstetrics | Admitting: Obstetrics

## 2016-04-27 DIAGNOSIS — E2839 Other primary ovarian failure: Secondary | ICD-10-CM

## 2016-04-27 DIAGNOSIS — Z1231 Encounter for screening mammogram for malignant neoplasm of breast: Secondary | ICD-10-CM

## 2016-04-27 DIAGNOSIS — Z780 Asymptomatic menopausal state: Secondary | ICD-10-CM | POA: Diagnosis not present

## 2016-04-27 DIAGNOSIS — M810 Age-related osteoporosis without current pathological fracture: Secondary | ICD-10-CM | POA: Diagnosis not present

## 2016-05-01 DIAGNOSIS — M76892 Other specified enthesopathies of left lower limb, excluding foot: Secondary | ICD-10-CM | POA: Diagnosis not present

## 2016-05-18 DIAGNOSIS — M810 Age-related osteoporosis without current pathological fracture: Secondary | ICD-10-CM | POA: Diagnosis not present

## 2017-01-09 DIAGNOSIS — Z131 Encounter for screening for diabetes mellitus: Secondary | ICD-10-CM | POA: Diagnosis not present

## 2017-01-09 DIAGNOSIS — Z01419 Encounter for gynecological examination (general) (routine) without abnormal findings: Secondary | ICD-10-CM | POA: Diagnosis not present

## 2017-01-09 DIAGNOSIS — Z130 Encounter for screening for diseases of the blood and blood-forming organs and certain disorders involving the immune mechanism: Secondary | ICD-10-CM | POA: Diagnosis not present

## 2017-01-09 DIAGNOSIS — Z0000 Encounter for general adult medical examination without abnormal findings: Secondary | ICD-10-CM | POA: Diagnosis not present

## 2017-01-09 DIAGNOSIS — Z13220 Encounter for screening for lipoid disorders: Secondary | ICD-10-CM | POA: Diagnosis not present

## 2017-01-10 DIAGNOSIS — E875 Hyperkalemia: Secondary | ICD-10-CM | POA: Diagnosis not present

## 2017-01-15 DIAGNOSIS — E878 Other disorders of electrolyte and fluid balance, not elsewhere classified: Secondary | ICD-10-CM | POA: Diagnosis not present

## 2017-01-15 DIAGNOSIS — E875 Hyperkalemia: Secondary | ICD-10-CM | POA: Diagnosis not present

## 2017-01-15 DIAGNOSIS — M549 Dorsalgia, unspecified: Secondary | ICD-10-CM | POA: Diagnosis not present

## 2017-01-17 DIAGNOSIS — Z85820 Personal history of malignant melanoma of skin: Secondary | ICD-10-CM | POA: Diagnosis not present

## 2017-01-17 DIAGNOSIS — L720 Epidermal cyst: Secondary | ICD-10-CM | POA: Diagnosis not present

## 2017-01-17 DIAGNOSIS — L723 Sebaceous cyst: Secondary | ICD-10-CM | POA: Diagnosis not present

## 2017-02-13 DIAGNOSIS — N951 Menopausal and female climacteric states: Secondary | ICD-10-CM | POA: Diagnosis not present

## 2017-02-13 DIAGNOSIS — R899 Unspecified abnormal finding in specimens from other organs, systems and tissues: Secondary | ICD-10-CM | POA: Diagnosis not present

## 2017-02-13 DIAGNOSIS — Z85820 Personal history of malignant melanoma of skin: Secondary | ICD-10-CM | POA: Diagnosis not present

## 2017-02-13 DIAGNOSIS — Z119 Encounter for screening for infectious and parasitic diseases, unspecified: Secondary | ICD-10-CM | POA: Diagnosis not present

## 2017-03-26 ENCOUNTER — Other Ambulatory Visit: Payer: Self-pay | Admitting: Obstetrics

## 2017-03-26 DIAGNOSIS — Z139 Encounter for screening, unspecified: Secondary | ICD-10-CM

## 2017-04-30 ENCOUNTER — Ambulatory Visit
Admission: RE | Admit: 2017-04-30 | Discharge: 2017-04-30 | Disposition: A | Discharge status: Home / Self Care | Payer: 59 | Source: Ambulatory Visit | Attending: Obstetrics | Admitting: Obstetrics

## 2017-04-30 DIAGNOSIS — Z139 Encounter for screening, unspecified: Secondary | ICD-10-CM

## 2017-04-30 DIAGNOSIS — Z1231 Encounter for screening mammogram for malignant neoplasm of breast: Secondary | ICD-10-CM | POA: Diagnosis not present

## 2017-09-25 DIAGNOSIS — Z0000 Encounter for general adult medical examination without abnormal findings: Secondary | ICD-10-CM | POA: Diagnosis not present

## 2017-09-25 DIAGNOSIS — N951 Menopausal and female climacteric states: Secondary | ICD-10-CM | POA: Diagnosis not present

## 2017-09-25 DIAGNOSIS — Z13220 Encounter for screening for lipoid disorders: Secondary | ICD-10-CM | POA: Diagnosis not present

## 2017-09-25 DIAGNOSIS — Z85820 Personal history of malignant melanoma of skin: Secondary | ICD-10-CM | POA: Diagnosis not present

## 2017-09-25 DIAGNOSIS — M810 Age-related osteoporosis without current pathological fracture: Secondary | ICD-10-CM | POA: Diagnosis not present

## 2017-12-06 DIAGNOSIS — J069 Acute upper respiratory infection, unspecified: Secondary | ICD-10-CM | POA: Diagnosis not present

## 2017-12-06 MED FILL — AZITHROMYCIN 250 MG TABLET: 250 | 5 days supply | Qty: 6 | Fill #0

## 2017-12-06 MED FILL — BENZONATATE 100 MG CAPS: 100 | 10 days supply | Qty: 30 | Fill #0

## 2017-12-27 DIAGNOSIS — H2513 Age-related nuclear cataract, bilateral: Secondary | ICD-10-CM | POA: Diagnosis not present

## 2018-01-14 DIAGNOSIS — R7989 Other specified abnormal findings of blood chemistry: Secondary | ICD-10-CM | POA: Diagnosis not present

## 2018-01-14 DIAGNOSIS — Z01419 Encounter for gynecological examination (general) (routine) without abnormal findings: Secondary | ICD-10-CM | POA: Diagnosis not present

## 2018-01-14 DIAGNOSIS — Z6820 Body mass index (BMI) 20.0-20.9, adult: Secondary | ICD-10-CM | POA: Diagnosis not present

## 2018-02-25 DIAGNOSIS — R233 Spontaneous ecchymoses: Secondary | ICD-10-CM | POA: Diagnosis not present

## 2018-02-25 DIAGNOSIS — D2262 Melanocytic nevi of left upper limb, including shoulder: Secondary | ICD-10-CM | POA: Diagnosis not present

## 2018-02-25 DIAGNOSIS — Z85820 Personal history of malignant melanoma of skin: Secondary | ICD-10-CM | POA: Diagnosis not present

## 2018-03-20 ENCOUNTER — Other Ambulatory Visit: Payer: Self-pay | Admitting: Obstetrics

## 2018-03-20 DIAGNOSIS — Z1231 Encounter for screening mammogram for malignant neoplasm of breast: Secondary | ICD-10-CM

## 2018-05-02 ENCOUNTER — Ambulatory Visit
Admission: RE | Admit: 2018-05-02 | Discharge: 2018-05-02 | Disposition: A | Discharge status: Home / Self Care | Payer: 59 | Source: Ambulatory Visit | Attending: Obstetrics | Admitting: Obstetrics

## 2018-05-02 DIAGNOSIS — Z1231 Encounter for screening mammogram for malignant neoplasm of breast: Secondary | ICD-10-CM | POA: Diagnosis not present

## 2018-05-06 ENCOUNTER — Other Ambulatory Visit: Payer: Self-pay | Admitting: Obstetrics

## 2018-05-06 DIAGNOSIS — R928 Other abnormal and inconclusive findings on diagnostic imaging of breast: Secondary | ICD-10-CM

## 2018-05-08 ENCOUNTER — Ambulatory Visit
Admission: RE | Admit: 2018-05-08 | Discharge: 2018-05-08 | Disposition: A | Discharge status: Home / Self Care | Payer: 59 | Source: Ambulatory Visit | Attending: Obstetrics | Admitting: Obstetrics

## 2018-05-08 ENCOUNTER — Other Ambulatory Visit: Payer: Self-pay | Admitting: Obstetrics

## 2018-05-08 DIAGNOSIS — N6311 Unspecified lump in the right breast, upper outer quadrant: Secondary | ICD-10-CM | POA: Diagnosis not present

## 2018-05-08 DIAGNOSIS — R922 Inconclusive mammogram: Secondary | ICD-10-CM | POA: Diagnosis not present

## 2018-05-08 DIAGNOSIS — N6310 Unspecified lump in the right breast, unspecified quadrant: Secondary | ICD-10-CM

## 2018-05-08 DIAGNOSIS — R928 Other abnormal and inconclusive findings on diagnostic imaging of breast: Secondary | ICD-10-CM

## 2018-11-08 ENCOUNTER — Ambulatory Visit
Admission: RE | Admit: 2018-11-08 | Discharge: 2018-11-08 | Disposition: A | Discharge status: Home / Self Care | Payer: 59 | Source: Ambulatory Visit | Attending: Obstetrics | Admitting: Obstetrics

## 2018-11-08 ENCOUNTER — Other Ambulatory Visit: Payer: Self-pay

## 2018-11-08 ENCOUNTER — Other Ambulatory Visit: Payer: Self-pay | Admitting: Obstetrics

## 2018-11-08 DIAGNOSIS — N6310 Unspecified lump in the right breast, unspecified quadrant: Secondary | ICD-10-CM

## 2019-01-22 DIAGNOSIS — Z9189 Other specified personal risk factors, not elsewhere classified: Secondary | ICD-10-CM | POA: Diagnosis not present

## 2019-01-22 DIAGNOSIS — Z1231 Encounter for screening mammogram for malignant neoplasm of breast: Secondary | ICD-10-CM | POA: Diagnosis not present

## 2019-01-22 DIAGNOSIS — Z13220 Encounter for screening for lipoid disorders: Secondary | ICD-10-CM | POA: Diagnosis not present

## 2019-01-22 DIAGNOSIS — Z8249 Family history of ischemic heart disease and other diseases of the circulatory system: Secondary | ICD-10-CM | POA: Diagnosis not present

## 2019-01-22 DIAGNOSIS — Z0000 Encounter for general adult medical examination without abnormal findings: Secondary | ICD-10-CM | POA: Diagnosis not present

## 2019-01-22 DIAGNOSIS — Z1211 Encounter for screening for malignant neoplasm of colon: Secondary | ICD-10-CM | POA: Diagnosis not present

## 2019-01-29 ENCOUNTER — Other Ambulatory Visit: Payer: Self-pay | Admitting: Obstetrics

## 2019-01-29 DIAGNOSIS — M810 Age-related osteoporosis without current pathological fracture: Secondary | ICD-10-CM

## 2019-01-29 DIAGNOSIS — N952 Postmenopausal atrophic vaginitis: Secondary | ICD-10-CM | POA: Diagnosis not present

## 2019-01-29 DIAGNOSIS — Z6820 Body mass index (BMI) 20.0-20.9, adult: Secondary | ICD-10-CM | POA: Diagnosis not present

## 2019-01-29 DIAGNOSIS — Z01419 Encounter for gynecological examination (general) (routine) without abnormal findings: Secondary | ICD-10-CM | POA: Diagnosis not present

## 2019-05-05 ENCOUNTER — Other Ambulatory Visit: Payer: Self-pay

## 2019-05-05 ENCOUNTER — Other Ambulatory Visit: Payer: 59

## 2019-05-05 ENCOUNTER — Ambulatory Visit
Admission: RE | Admit: 2019-05-05 | Discharge: 2019-05-05 | Disposition: A | Discharge status: Home / Self Care | Payer: Managed Care, Other (non HMO) | Source: Ambulatory Visit | Attending: Obstetrics | Admitting: Obstetrics

## 2019-05-05 DIAGNOSIS — M810 Age-related osteoporosis without current pathological fracture: Secondary | ICD-10-CM

## 2019-06-20 ENCOUNTER — Other Ambulatory Visit: Payer: Self-pay

## 2019-06-20 ENCOUNTER — Ambulatory Visit
Admission: RE | Admit: 2019-06-20 | Discharge: 2019-06-20 | Disposition: A | Discharge status: Home / Self Care | Payer: Managed Care, Other (non HMO) | Source: Ambulatory Visit | Attending: Obstetrics | Admitting: Obstetrics

## 2019-06-20 DIAGNOSIS — N6310 Unspecified lump in the right breast, unspecified quadrant: Secondary | ICD-10-CM

## 2020-06-03 ENCOUNTER — Other Ambulatory Visit: Payer: Self-pay | Admitting: Obstetrics

## 2020-06-03 DIAGNOSIS — Z1231 Encounter for screening mammogram for malignant neoplasm of breast: Secondary | ICD-10-CM

## 2020-07-12 ENCOUNTER — Other Ambulatory Visit: Payer: Self-pay | Admitting: Obstetrics

## 2020-07-12 DIAGNOSIS — N63 Unspecified lump in unspecified breast: Secondary | ICD-10-CM

## 2020-07-15 ENCOUNTER — Other Ambulatory Visit: Payer: Self-pay

## 2020-07-15 ENCOUNTER — Ambulatory Visit
Admission: RE | Admit: 2020-07-15 | Discharge: 2020-07-15 | Disposition: A | Discharge status: Home / Self Care | Payer: Managed Care, Other (non HMO) | Source: Ambulatory Visit | Attending: Obstetrics | Admitting: Obstetrics

## 2020-07-15 DIAGNOSIS — N63 Unspecified lump in unspecified breast: Secondary | ICD-10-CM

## 2021-06-03 ENCOUNTER — Other Ambulatory Visit: Payer: Self-pay | Admitting: Obstetrics

## 2021-06-03 DIAGNOSIS — Z1231 Encounter for screening mammogram for malignant neoplasm of breast: Secondary | ICD-10-CM

## 2021-06-15 DIAGNOSIS — Z0000 Encounter for general adult medical examination without abnormal findings: Secondary | ICD-10-CM | POA: Diagnosis not present

## 2021-06-15 DIAGNOSIS — Z13220 Encounter for screening for lipoid disorders: Secondary | ICD-10-CM | POA: Diagnosis not present

## 2021-06-27 DIAGNOSIS — D1801 Hemangioma of skin and subcutaneous tissue: Secondary | ICD-10-CM | POA: Diagnosis not present

## 2021-06-27 DIAGNOSIS — D225 Melanocytic nevi of trunk: Secondary | ICD-10-CM | POA: Diagnosis not present

## 2021-06-27 DIAGNOSIS — D2261 Melanocytic nevi of right upper limb, including shoulder: Secondary | ICD-10-CM | POA: Diagnosis not present

## 2021-06-27 DIAGNOSIS — D2262 Melanocytic nevi of left upper limb, including shoulder: Secondary | ICD-10-CM | POA: Diagnosis not present

## 2021-06-27 DIAGNOSIS — L738 Other specified follicular disorders: Secondary | ICD-10-CM | POA: Diagnosis not present

## 2021-06-27 DIAGNOSIS — Z85820 Personal history of malignant melanoma of skin: Secondary | ICD-10-CM | POA: Diagnosis not present

## 2021-07-25 ENCOUNTER — Ambulatory Visit
Admission: RE | Admit: 2021-07-25 | Discharge: 2021-07-25 | Disposition: A | Discharge status: Home / Self Care | Payer: 59 | Source: Ambulatory Visit | Attending: Obstetrics | Admitting: Obstetrics

## 2021-07-25 DIAGNOSIS — Z1231 Encounter for screening mammogram for malignant neoplasm of breast: Secondary | ICD-10-CM | POA: Diagnosis not present

## 2022-03-24 ENCOUNTER — Other Ambulatory Visit: Payer: Self-pay | Admitting: Obstetrics

## 2022-03-24 DIAGNOSIS — M810 Age-related osteoporosis without current pathological fracture: Secondary | ICD-10-CM

## 2022-03-24 DIAGNOSIS — Z1231 Encounter for screening mammogram for malignant neoplasm of breast: Secondary | ICD-10-CM

## 2022-06-26 ENCOUNTER — Ambulatory Visit
Admission: RE | Admit: 2022-06-26 | Discharge: 2022-06-26 | Disposition: A | Discharge status: Home / Self Care | Payer: Medicare Other | Source: Ambulatory Visit | Attending: Obstetrics | Admitting: Obstetrics

## 2022-06-26 DIAGNOSIS — Z1231 Encounter for screening mammogram for malignant neoplasm of breast: Secondary | ICD-10-CM

## 2022-08-03 ENCOUNTER — Ambulatory Visit
Admission: RE | Admit: 2022-08-03 | Discharge: 2022-08-03 | Disposition: A | Discharge status: Home / Self Care | Payer: Medicare Other | Source: Ambulatory Visit | Attending: Obstetrics | Admitting: Obstetrics

## 2022-09-04 ENCOUNTER — Ambulatory Visit: Payer: 59

## 2022-09-04 ENCOUNTER — Other Ambulatory Visit: Payer: 59

## 2022-09-29 ENCOUNTER — Ambulatory Visit
Admission: RE | Admit: 2022-09-29 | Discharge: 2022-09-29 | Disposition: A | Discharge status: Home / Self Care | Payer: Medicare Other | Source: Ambulatory Visit | Attending: Obstetrics | Admitting: Obstetrics

## 2022-09-29 DIAGNOSIS — M810 Age-related osteoporosis without current pathological fracture: Secondary | ICD-10-CM

## 2023-04-10 IMAGING — US US BREAST*R* LIMITED INC AXILLA
1 series · 5 of 5 positions shown · non-contrast
Comparison: Previous exam(s).

CLINICAL DATA: 64-year-old female presenting for annual exam as
well as 2 year follow-up of a probably benign right breast mass.

EXAM:
DIGITAL DIAGNOSTIC BILATERAL MAMMOGRAM WITH TOMOSYNTHESIS AND CAD;
ULTRASOUND RIGHT BREAST LIMITED
TECHNIQUE: Bilateral digital diagnostic mammography and breast tomosynthesis
was performed. The images were evaluated with computer-aided
detection.; Targeted ultrasound examination of the right breast was
performed

[Series 1: us breast*right* limited inc axilla · 0.04mm/px · 5 of 5 slices shown]
[im 1/5]
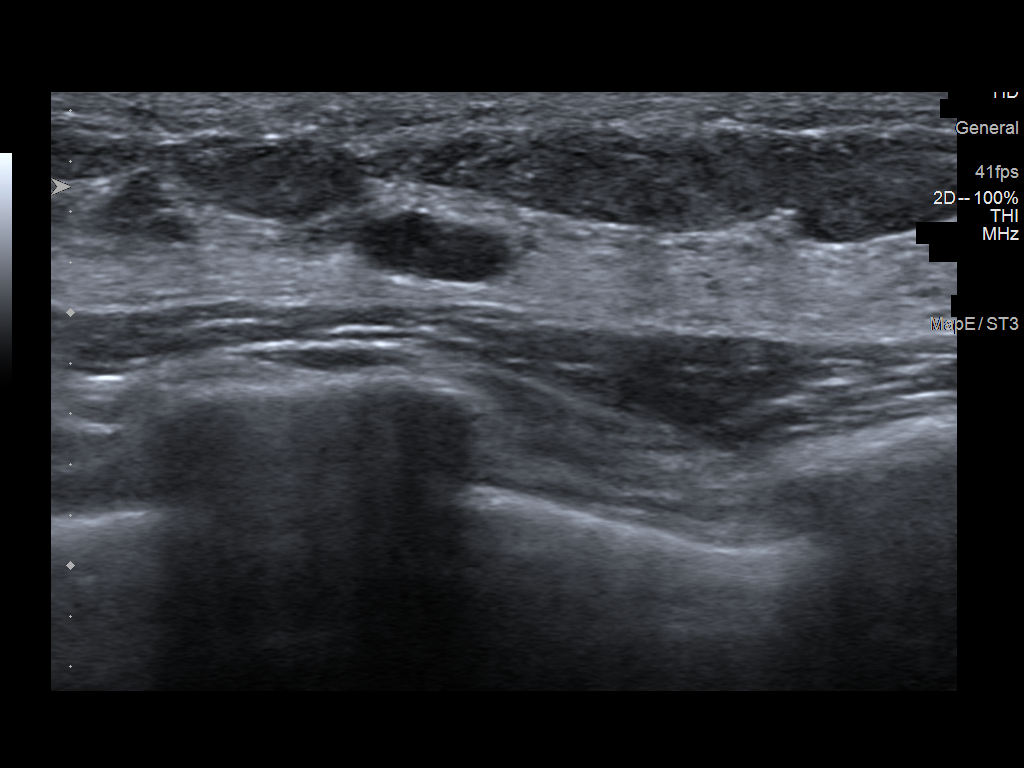
[im 2/5]
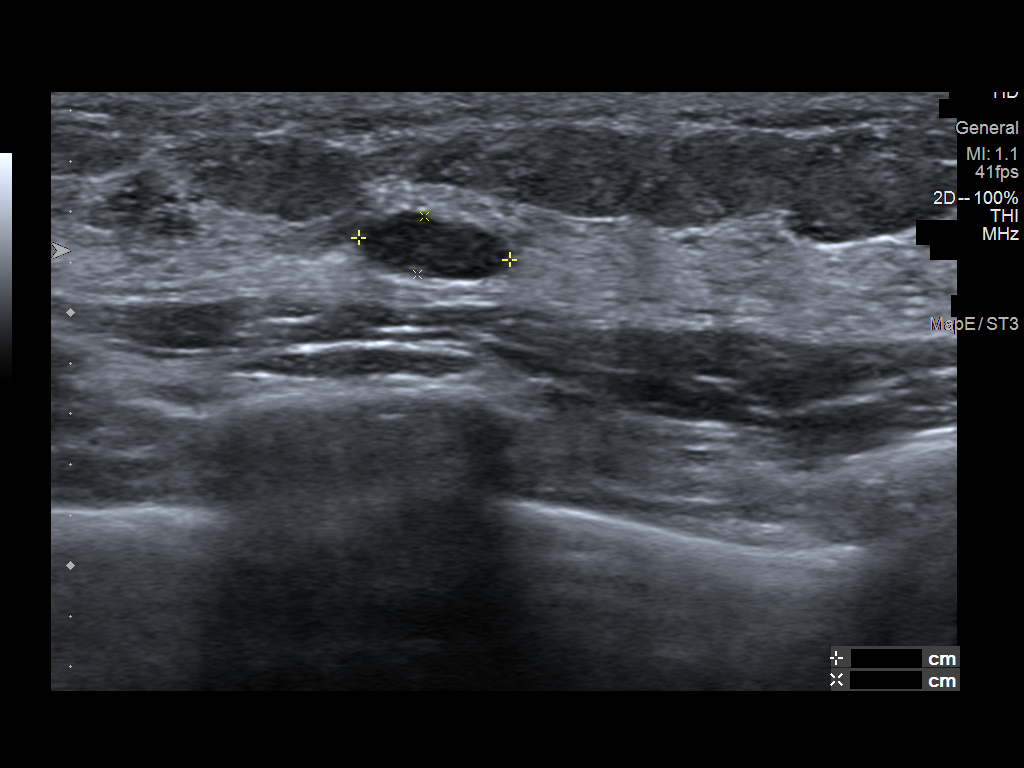
[im 3/5]
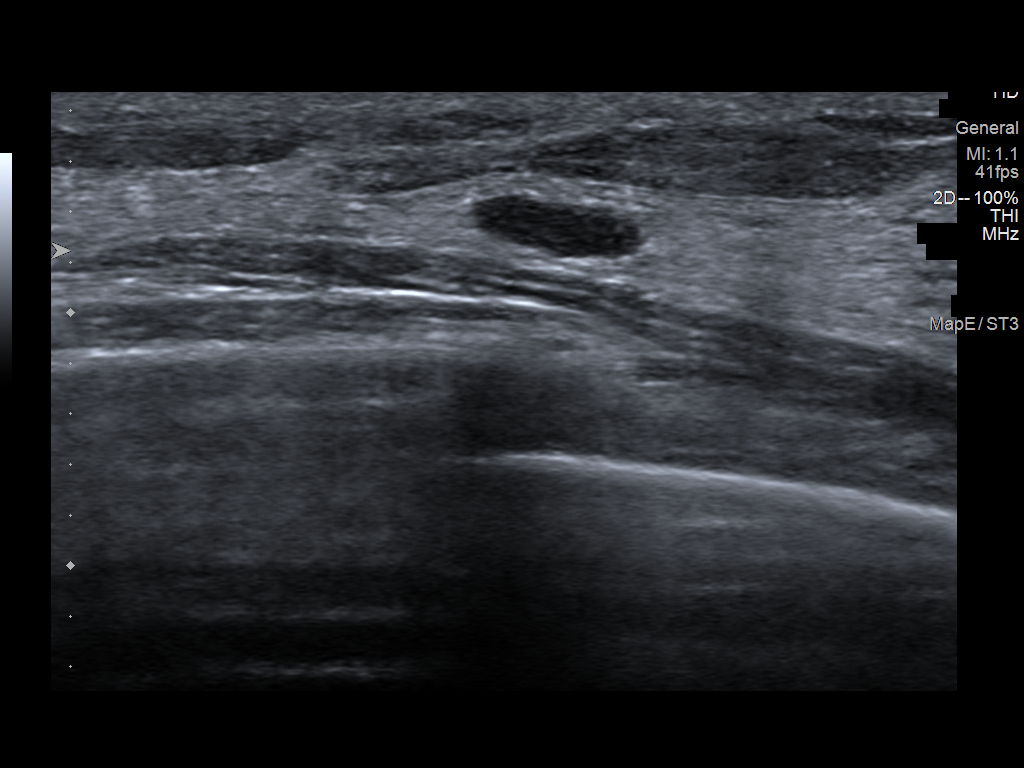
[im 4/5]
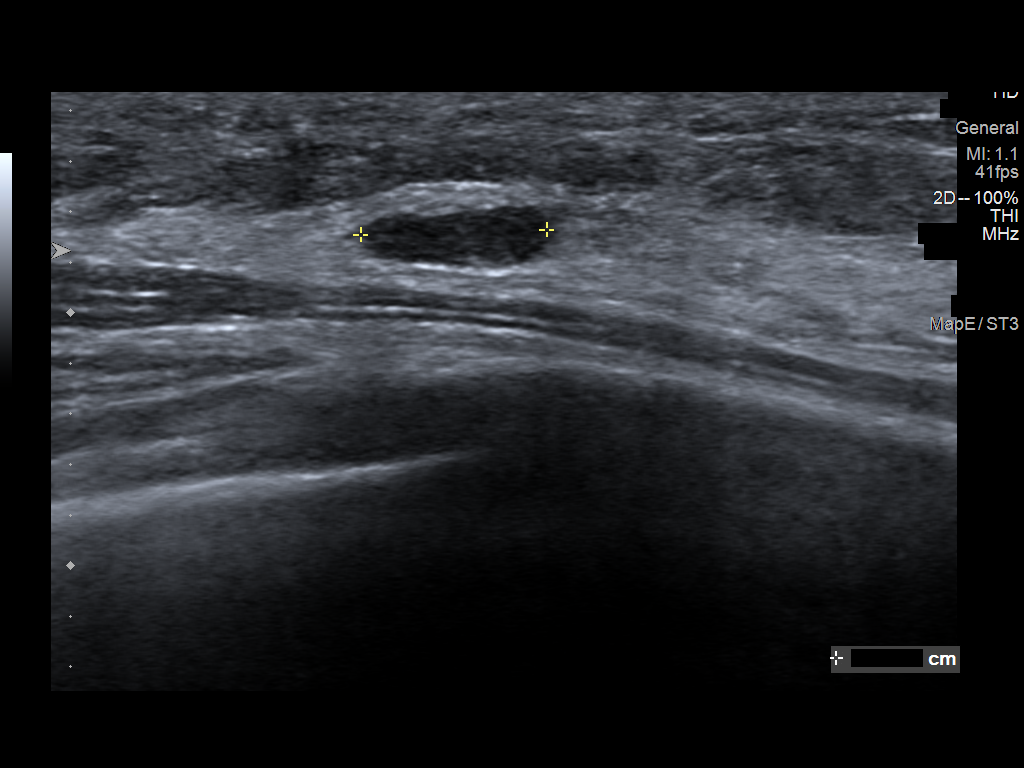
[im 5/5]
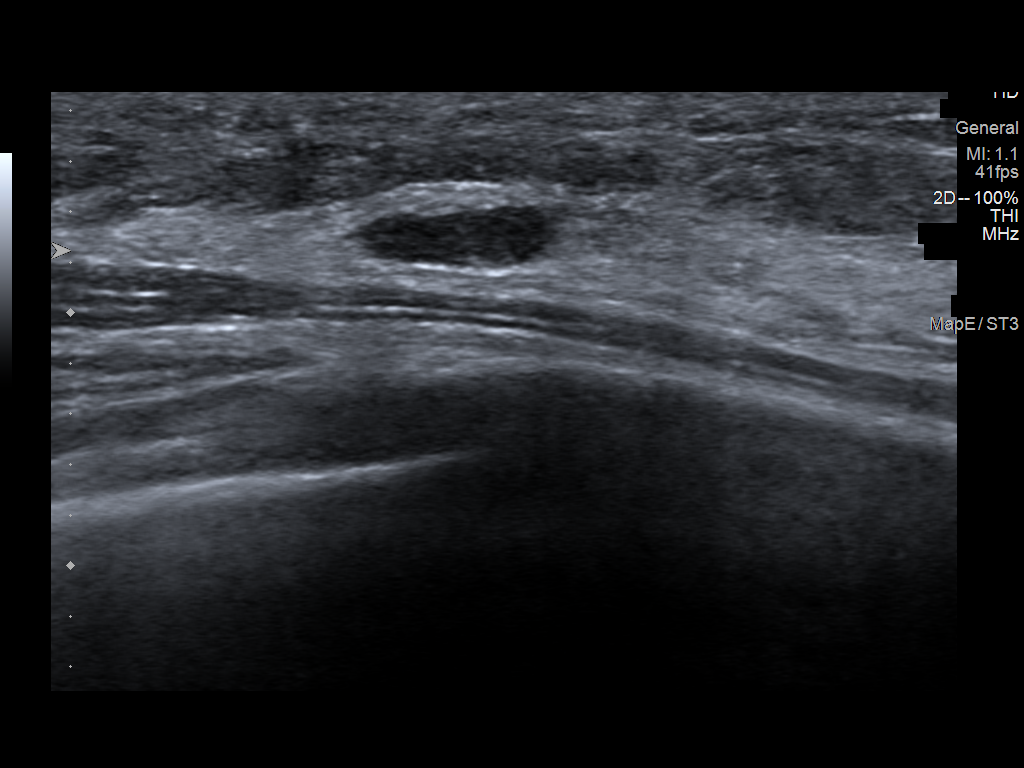

[5 of 5 positions shown; findings below may reference images not displayed]

ACR Breast Density Category c: The breast tissue is heterogeneously
dense, which may obscure small masses.
FINDINGS: Mammogram:

Right breast: No suspicious mass, distortion, or microcalcifications
are identified to suggest presence of malignancy. The patient's
known mass in the upper outer quadrant is not definitely visualized
mammographically.

Left breast: No suspicious mass, distortion, or microcalcifications
are identified to suggest presence of malignancy.

Ultrasound:

Targeted ultrasound performed in the right breast at 10 o'clock 4 cm
from the nipple demonstrating an oval circumscribed hypoechoic mass
measuring 0.6 x 0.2 x 0.7 cm, previously measuring 0.6 x 0.2 x
cm.
IMPRESSION: 1. Stable mass in the right breast at 10 o'clock. Given stability
for over 2 years, this is considered benign.

2.  No mammographic evidence of malignancy in the bilateral breasts.

RECOMMENDATION:
Screening mammogram in one year.(Code:51-W-QSW)

I have discussed the findings and recommendations with the patient.
If applicable, a reminder letter will be sent to the patient
regarding the next appointment.

BI-RADS CATEGORY  2: Benign.

## 2023-06-21 ENCOUNTER — Other Ambulatory Visit: Payer: Self-pay | Admitting: Obstetrics

## 2023-06-21 DIAGNOSIS — Z0000 Encounter for general adult medical examination without abnormal findings: Secondary | ICD-10-CM

## 2023-07-17 ENCOUNTER — Ambulatory Visit

## 2023-08-21 ENCOUNTER — Ambulatory Visit

## 2023-08-23 ENCOUNTER — Ambulatory Visit
Admission: RE | Admit: 2023-08-23 | Discharge: 2023-08-23 | Disposition: A | Discharge status: Home / Self Care | Source: Ambulatory Visit | Attending: Obstetrics | Admitting: Obstetrics

## 2023-08-23 DIAGNOSIS — Z0000 Encounter for general adult medical examination without abnormal findings: Secondary | ICD-10-CM

## 2024-01-11 ENCOUNTER — Encounter (HOSPITAL_COMMUNITY): Payer: Self-pay

## 2024-01-11 ENCOUNTER — Other Ambulatory Visit: Payer: Self-pay

## 2024-01-11 ENCOUNTER — Emergency Department (HOSPITAL_COMMUNITY)

## 2024-01-11 ENCOUNTER — Emergency Department (HOSPITAL_COMMUNITY)
Admission: EM | Admit: 2024-01-11 | Discharge: 2024-01-11 | Disposition: A | Discharge status: Home / Self Care | Attending: Emergency Medicine | Admitting: Emergency Medicine

## 2024-01-11 DIAGNOSIS — R079 Chest pain, unspecified: Secondary | ICD-10-CM | POA: Diagnosis present

## 2024-01-11 LAB — BASIC METABOLIC PANEL WITH GFR
Anion gap: 9 (ref 5–15)
BUN: 11 mg/dL (ref 8–23)
CO2: 26 mmol/L (ref 22–32)
Calcium: 9.2 mg/dL (ref 8.9–10.3)
Chloride: 102 mmol/L (ref 98–111)
Creatinine, Ser: 0.82 mg/dL (ref 0.44–1.00)
GFR, Estimated: >60 mL/min (ref >60)
Glucose, Bld: 96 mg/dL (ref 70–99)
Potassium: 4 mmol/L (ref 3.5–5.1)
Sodium: 137 mmol/L (ref 135–145)

## 2024-01-11 LAB — CBC
HCT: 38.2 % (ref 36.0–46.0)
Hemoglobin: 13 g/dL (ref 12.0–15.0)
MCH: 31.3 pg (ref 26.0–34.0)
MCHC: 34 g/dL (ref 30.0–36.0)
MCV: 91.8 fL (ref 80.0–100.0)
Platelets: 285 K/uL (ref 150–400)
RBC: 4.16 MIL/uL (ref 3.87–5.11)
RDW: 12 % (ref 11.5–15.5)
WBC: 6 K/uL (ref 4.0–10.5)
nRBC: 0 % (ref 0.0–0.2)

## 2024-01-11 LAB — TROPONIN I (HIGH SENSITIVITY)
Troponin I (High Sensitivity): 3 ng/L (ref <18)
Troponin I (High Sensitivity): 4 ng/L (ref <18)

## 2024-01-11 MED ORDER — LIDOCAINE VISCOUS HCL 2 % MT SOLN
15.0000 mL | Freq: Once | OROMUCOSAL | Status: AC
  Administered 2024-01-11: 15 mL via ORAL
  Filled 2024-01-11: qty 15

## 2024-01-11 MED ORDER — ALUM & MAG HYDROXIDE-SIMETH 200-200-20 MG/5ML PO SUSP
30.0000 mL | Freq: Once | ORAL | Status: AC
  Administered 2024-01-11: 30 mL via ORAL
  Filled 2024-01-11: qty 30

## 2024-01-11 NOTE — ED Notes (Signed)
 CCMD called by this RN

## 2024-01-11 NOTE — ED Provider Triage Note (Signed)
 Emergency Medicine Provider Triage Evaluation Note  [name] , a 68 y.o. female  was evaluated in triage.  Pt complains of chest pain.  Reports that she was in her normal state of health this morning, went on a walk, did yoga, went to the grocery store.  Was chest pain-free during these activities, reports that while she was making a salad she developed chest pain and shortness of breath.  Reports that she has ongoing mild chest pain.  Denies history of similar prior chest pain, history of heart disease.  Review of Systems  Positive: Chest pain, shortness of breath Negative: Fever, chills  Physical Exam  BP (!) 171/86   Pulse 79   Temp 98.1 F (36.7 C)   Resp 16   Ht 5' 2 (1.575 m)   Wt 52.2 kg   SpO2 100%   BMI 21.03 kg/m  Gen:   Awake, no distress   Resp:  Normal effort  MSK:   Moves extremities without difficulty  Other:  Rate regular  Medical Decision Making  Medically screening exam initiated at 2:21 PM.  Appropriate orders placed.  [name] was informed that the remainder of the evaluation will be completed by another provider, this initial triage assessment does not replace that evaluation, and the importance of remaining in the ED until their evaluation is complete.     [name], MD 01/11/24 [phone]

## 2024-01-11 NOTE — Discharge Instructions (Addendum)
 You were seen today for chest pain. While you were here we monitored your vitals, performed a physical exam, and lab work. These were all reassuring and there is no indication for any further testing or intervention in the emergency department at this time.   We have provided you with a referral to cardiology.  They will be calling you to make an appointment to be seen in the office.  Things to do:  - Follow up with your primary care provider within the next few days to discuss if an echocardiogram or stress test are indicated  Return to the emergency department if you have any new or worsening symptoms including chest pain, shortness of breath, passing out, or if you have any other concerns.

## 2024-01-11 NOTE — ED Notes (Signed)
 Awaiting pt from lobby

## 2024-01-11 NOTE — ED Triage Notes (Signed)
 Pt c.o chest pain that started this morning after attending a yoga class. Pt was sent here from UC for further eval. UC gave 3L [place] for comfort and pt reports some relief of her chest pain.

## 2024-01-11 NOTE — ED Provider Notes (Signed)
 [place] EMERGENCY DEPARTMENT AT [facility] Provider Note   CSN: [id number] Arrival date & time: 01/11/24  1357     Patient presents with: Chest Pain   [name] is a 68 y.o. female with with no significant past medical history sent from urgent care for chest pain.  Patient reports earlier this afternoon while making a salad she developed sudden onset chest pain and shortness of breath.  Prior to this she went for her morning walk, yoga, and went to the grocery store.  At this time, she denies ongoing chest pain.  She endorses some associated queasiness but denies nausea, vomiting, or diaphoresis.  Patient denies previously experiencing similar chest pain and has no history of hypertension, diabetes, heart problems.    Chest Pain      Prior to Admission medications   Medication Sig Start Date End Date Taking? Authorizing Provider  acetaminophen  (TYLENOL ) 500 MG tablet Take 1,000 mg by mouth every 6 (six) hours as needed.    [provider]  amitriptyline (ELAVIL) 25 MG tablet Take 25 mg by mouth daily. 11/25/13   [provider]  B Complex-C (B-COMPLEX WITH VITAMIN C) tablet Take 1 tablet by mouth daily.    [provider]  Calcium -Vitamin D  600-200 MG-UNIT per tablet Take 1 tablet by mouth daily.    [provider]  cholecalciferol  (VITAMIN D ) 1000 UNITS tablet Take 2,000 Units by mouth daily.    [provider]  estradiol  (VIVELLE -DOT) 0.05 MG/24HR Place 1 patch onto the skin 2 (two) times a week.    [provider]  Fish Oil OIL Take 5 mLs by mouth daily.    [provider]  lactobacillus acidophilus (BACID) TABS Take 1 tablet by mouth daily.    [provider]  Multiple Vitamins-Minerals (MULTIVITAMINS THER. W/MINERALS) TABS Take 1 tablet by mouth daily.    [provider]  naproxen (NAPROSYN) 250 MG tablet Take 250 mg by mouth 2 (two) times daily with a meal.    [provider]  progesterone  (PROMETRIUM ) 100 MG capsule Take 100 mg by mouth daily.    [provider]    Allergies: Penicillins and Thimerosal (thiomersal)    Review of Systems  Cardiovascular:  Positive for chest pain.    Updated Vital Signs BP 134/83   Pulse 66   Temp 98.4 F (36.9 C) (Oral)   Resp 17   Ht 5' 2 (1.575 m)   Wt 52.2 kg   SpO2 100%   BMI 21.03 kg/m   Physical Exam Vitals and nursing note reviewed.  Constitutional:      General: She is not in acute distress.    Appearance: She is well-developed.  HENT:     Head: Normocephalic and atraumatic.  Eyes:     Conjunctiva/sclera: Conjunctivae normal.  Cardiovascular:     Rate and Rhythm: Normal rate and regular rhythm.     Heart sounds: No murmur heard. Pulmonary:     Effort: Pulmonary effort is normal. No respiratory distress.     Breath sounds: Normal breath sounds.  Abdominal:     Palpations: Abdomen is soft.     Tenderness: There is no abdominal tenderness.  Musculoskeletal:        General: No swelling.     Cervical back: Neck supple.  Skin:    General: Skin is warm and dry.     Capillary Refill: Capillary refill takes less than 2 seconds.  Neurological:  Mental Status: She is alert.     (all labs ordered are listed, but only abnormal results are displayed) Labs Reviewed  BASIC METABOLIC PANEL WITH GFR  CBC  TROPONIN I (HIGH SENSITIVITY)  TROPONIN I (HIGH SENSITIVITY)    EKG: EKG Interpretation Date/Time:  Friday January 11 2024 14:07:10 EDT Ventricular Rate:  74 PR Interval:  126 QRS Duration:  84 QT Interval:  382 QTC Calculation: 424 R Axis:   64  Text Interpretation: Normal sinus rhythm Normal ECG No previous ECGs available Confirmed by Pamella Sharper [phone]) on 01/11/2024 6:46:50 PM  Radiology: DG Chest Portable 1 View Result Date: 01/11/2024 CLINICAL DATA:  Chest pain EXAM: PORTABLE CHEST 1 VIEW COMPARISON:  Chest radiograph dated 02/25/2008 FINDINGS: Normal  lung volumes. Hazy lateral right upper and left mid lung opacities. Nodular density at the mid right lower lung, likely nipple shadow. No pleural effusion or pneumothorax. The heart size and mediastinal contours are within normal limits. Cervical spinal fixation hardware appears intact. IMPRESSION: Hazy lateral right upper and left mid lung opacities, which may represent atelectasis or pneumonia. Electronically Signed   By: Limin  Xu M.D.   On: 01/11/2024 15:28     Procedures   Medications Ordered in the ED  alum & mag hydroxide-simeth (MAALOX/MYLANTA) 200-200-20 MG/5ML suspension 30 mL (30 mLs Oral Given 01/11/24 1813)    And  lidocaine  (XYLOCAINE ) 2 % viscous mouth solution 15 mL (15 mLs Oral Given 01/11/24 1813)                                    Medical Decision Making Patient is an active, otherwise healthy 68 year old female presenting for chest pain that has since resolved.  Patient denies any previous history of chest pain.  She has no history of hypertension, hyperlipidemia, diabetes, or CAD.  On evaluation, patient is hemodynamically stable in no acute distress.  Her exam is reassuring without arrhythmia or murmur.  Differential diagnosis includes but not limited to ACS, pneumonia, viral illness, GERD, PE  Patient given GI cocktail in the setting of reported queasiness  EKG I obtained reveals normal sinus rhythm, no STEMI, New-onset Arrhythmia, or ischemic equivalent. Therefore do not suspect ACS at this time.   CXR unremarkable for focal airspace disease, patient is afebrile, no cough, and  no leukocytosis, do not suspect Pneumonia  Unlikely Pulmonary Embolism with well's score of 0, low probability. Therefore will not obtain CTA Chest or D-dimer.Pain is not described as tearing and does not radiate to back, no pulse deficit, no neurologic complaints. CXR does not show widened mediastinum. Doubt Aortic Dissection.  Patient was appropriately risk stratified with HEAR score of 2,  low risk. Initial troponin 3. Second troponin 3. Based on heart pathway patient is clinically appropriate for discharge with outpatient follow-up.   On reevaluation, patient continues to endorse resolution of her chest pain at this time.  She agrees with the plan for close outpatient follow-up.  Patient remained hemodynamically stable throughout her time in the emergency department.  Strict return precautions were given and patient was discharged home in stable condition.  Amount and/or Complexity of Data Reviewed Labs: ordered. Radiology: ordered and independent interpretation performed. ECG/medicine tests: ordered and independent interpretation performed.  Risk OTC drugs. Prescription drug management.       Final diagnoses:  Chest pain, unspecified type    ED Discharge Orders  Ordered    Ambulatory referral to Cardiology       Comments: If you have not heard from the Cardiology office within the next 72 hours please call [phone].   01/11/24 8144               Sharlet Dowdy, MD 01/12/24 0032    Pamella Ozell LABOR, DO 01/15/24 0101

## 2024-01-15 NOTE — Progress Notes (Unsigned)
 Cardiology Clinic Note   Patient Name: [name] Date of Encounter: 01/17/2024  Primary Care Provider:  Elliot Charm, MD Primary Cardiologist:  None  Patient Profile    [name] 68 year old female presents to the clinic today for evaluation of her chest discomfort.  Past Medical History    Past Medical History:  Diagnosis Date   Chronic back pain    buldging disc   PONV (postoperative nausea and vomiting)    Sciatica of left side 12/02/2013   Past Surgical History:  Procedure Laterality Date   CERVICAL SPINE SURGERY  2012   COLONOSCOPY  2004   ESOPHAGOGASTRODUODENOSCOPY     LUMBAR LAMINECTOMY/DECOMPRESSION MICRODISCECTOMY  06/06/2011   Procedure: LUMBAR LAMINECTOMY/DECOMPRESSION MICRODISCECTOMY 1 LEVEL;  Surgeon: Arley SHAUNNA Helling, MD;  Location: [facility];  Service: Neurosurgery;  Laterality: Left;  Left Lumbar four-five laminectomy/microdisectomy   MELANOMA EXCISION  earl;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    History of Present Illness    [name] has a PMH of left side sciatica, elevated bilirubin, leg melanoma, and skin cancer.  Reports family history of her mother having open heart surgery.  She was asymptomatic prior to her operation.  She presented to the emergency department on 01/11/2024.  She reported that she had chest pain and had been sent from the urgent care office.  She noted that earlier that afternoon she had been making salad and developed sudden onset chest pain.  She also reported shortness of breath.  Prior to that time she had been on a morning walk, did yoga, and went to the grocery store.  At the time of evaluation in the emergency department she denied ongoing chest pain.  She did note some queasiness but denied nausea vomiting and diaphoresis.  She denied prior episodes of chest  discomfort.  She denied history of hypertension, diabetes, and heart problems.  Her blood pressure was noted to be 134/84.  Her EKG showed normal sinus rhythm 74 bpm.  She received GI cocktail.  Her chest x-ray was unremarkable.  It was felt that she had low likelihood for PE and her Wells score was noted to be 0.  Her high-sensitivity troponin was noted to be 3 and an 4.  She was reevaluated and noted that she had resolution of her chest pain.  She was instructed to follow-up as an outpatient with cardiology.  She presents to the clinic today for evaluation and states she has not had any further episodes.  She reports that she felt a little bit off for about a week before the event.  She reports upset stomach and feeling queasy.  We reviewed her emergency department visit.  She expressed understanding.  I recommended proceeding with coronary CTA and echocardiogram given her presentation and family history.  We reviewed her cholesterol from June.  She expressed understanding.  Her HDL is 85 and her LDL is 111.  She wishes to think about the recommended exams and let us  know if she wishes to proceed.  I gave her our office information and we will wait on her decision.  Today she denies chest pain, shortness of breath, lower extremity edema, fatigue, palpitations, melena, hematuria, hemoptysis, diaphoresis, weakness, presyncope, syncope, orthopnea, and PND.    Home Medications    Prior to Admission medications  Medication Sig Start Date End Date Taking? Authorizing Provider  acetaminophen  (TYLENOL ) 500 MG tablet Take 1,000 mg by mouth every 6 (six) hours as needed.    [provider]  amitriptyline (ELAVIL) 25 MG tablet Take 25 mg by mouth daily. 11/25/13   [provider]  B Complex-C (B-COMPLEX WITH VITAMIN C) tablet Take 1 tablet by mouth daily.    [provider]  Calcium -Vitamin D  600-200 MG-UNIT per tablet Take 1 tablet by mouth daily.    [provider]   cholecalciferol  (VITAMIN D ) 1000 UNITS tablet Take 2,000 Units by mouth daily.    [provider]  estradiol  (VIVELLE -DOT) 0.05 MG/24HR Place 1 patch onto the skin 2 (two) times a week.    [provider]  Fish Oil OIL Take 5 mLs by mouth daily.    [provider]  lactobacillus acidophilus (BACID) TABS Take 1 tablet by mouth daily.    [provider]  Multiple Vitamins-Minerals (MULTIVITAMINS THER. W/MINERALS) TABS Take 1 tablet by mouth daily.    [provider]  naproxen (NAPROSYN) 250 MG tablet Take 250 mg by mouth 2 (two) times daily with a meal.    [provider]  progesterone  (PROMETRIUM ) 100 MG capsule Take 100 mg by mouth daily.    [provider]    Family History    Family History  Problem Relation Age of Onset   Heart Problems Mother    Hypertension Mother    Cancer Father    Emphysema Father    Anesthesia problems Neg Hx    Hypotension Neg Hx    Malignant hyperthermia Neg Hx    Pseudochol deficiency Neg Hx    Breast cancer Neg Hx    She indicated that her mother is alive. She indicated that her father is deceased. She indicated that her sister is alive. She indicated that her brother is alive. She indicated that the status of her neg hx is unknown.  Social History    Social History   Socioeconomic History   Marital status: Married    Spouse name: Not on file   Number of children: 2   Years of education: BS   Highest education level: Not on file  Occupational History   Occupation: admin asst    Employer: [employer]  Tobacco Use   Smoking status: Former    Current packs/day: 0.00    Types: Cigarettes    Quit date: 03/14/1979    Years since quitting: 44.8   Smokeless tobacco: Never   Tobacco comments:    quit 44yrs ago  Substance and Sexual Activity   Alcohol use: Yes    Comment: wine on the weekends   Drug use: No   Sexual activity: Yes  Other Topics Concern   Not on file  Social  History Narrative   Not on file   Social Drivers of Health   Financial Resource Strain: Not on file  Food Insecurity: Not on file  Transportation Needs: Not on file  Physical Activity: Not on file  Stress: Not on file  Social Connections: Not on file  Intimate Partner Violence: Not on file     Review of Systems    General:  No chills, fever, night sweats or weight changes.  Cardiovascular:  No chest pain, dyspnea on exertion, edema, orthopnea, palpitations, paroxysmal nocturnal dyspnea. Dermatological: No rash, lesions/masses Respiratory: No cough, dyspnea Urologic: No hematuria, dysuria Abdominal:   No nausea, vomiting, diarrhea, bright red blood per rectum, melena,  or hematemesis Neurologic:  No visual changes, wkns, changes in mental status. All other systems reviewed and are otherwise negative except as noted above.  Physical Exam    VS:  BP 120/70   Pulse 82   Ht 5' 2 (1.575 m)   Wt 115 lb 6.4 oz (52.3 kg)   SpO2 98%   BMI 21.11 kg/m  , BMI Body mass index is 21.11 kg/m. GEN: Well nourished, well developed, in no acute distress. HEENT: normal. Neck: Supple, no JVD, carotid bruits, or masses. Cardiac: RRR, no murmurs, rubs, or gallops. No clubbing, cyanosis, edema.  Radials/DP/PT 2+ and equal bilaterally.  Respiratory:  Respirations regular and unlabored, clear to auscultation bilaterally. GI: Soft, nontender, nondistended, BS + x 4. MS: no deformity or atrophy. Skin: warm and dry, no rash. Neuro:  Strength and sensation are intact. Psych: Normal affect.  Accessory Clinical Findings    Recent Labs: 01/11/2024: BUN 11; Creatinine, Ser 0.82; Hemoglobin 13.0; Platelets 285; Potassium 4.0; Sodium 137   Recent Lipid Panel No results found for: CHOL, TRIG, HDL, CHOLHDL, VLDL, LDLCALC, LDLDIRECT       ECG personally reviewed by me today-  none today.           Assessment & Plan   1.  Chest discomfort-no chest pain today.  Denies  exertional chest discomfort.  Was seen and evaluated in the emergency department on 01/11/2024.  She reported a sudden onset of chest pain and shortness of breath.  She received GI cocktail, chest x-ray unremarkable, cardiac troponins were noted to be 3-4.  Her CBC was unremarkable.  Her BMP showed stable renal function and electrolytes.  Has a family history of mother having open heart surgery.  Prior to her prior surgery she was asymptomatic. I recommended coronary CTA, echocardiogram-she wishes to think about this more before proceeding with testing (will need CBC and BMP prior to testing) Heart healthy low-sodium diet Order sublingual nitroglycerin  Shortness of breath-denies further episodes.  This was in the setting of chest discomfort.  She denies diaphoresis and other associated symptoms.  Denies history of respiratory issues or recent illness.  Denies sick contacts. Recommended ordering echocardiogram-will defer at this time (waiting on patient decision)  Disposition: Follow-up with Dr. Barbaraann or me in 1-2 months.   Josefa HERO. Tangie Stay NP-C     01/17/2024, 9:24 AM [facility] [address] [place], [place] [address] Office [phone]    Notice: This dictation was prepared with Dragon dictation along with smaller phrase technology. Any transcriptional errors that result from this process are unintentional and may not be corrected upon review.   I spent 14 minutes examining this patient, reviewing medications, and using patient centered shared decision making involving their cardiac care.   I spent  20 minutes reviewing past medical history,  medications, and prior cardiac tests.

## 2024-01-17 ENCOUNTER — Ambulatory Visit: Attending: General Practice | Admitting: General Practice

## 2024-01-17 ENCOUNTER — Encounter: Payer: Self-pay | Admitting: General Practice

## 2024-01-17 VITALS — BP 120/70 | HR 82 | Ht 62.0 in | Wt 115.4 lb

## 2024-01-17 DIAGNOSIS — R0609 Other forms of dyspnea: Secondary | ICD-10-CM | POA: Insufficient documentation

## 2024-01-17 DIAGNOSIS — R0789 Other chest pain: Secondary | ICD-10-CM | POA: Insufficient documentation

## 2024-01-17 MED ORDER — NITROGLYCERIN 0.4 MG SL SUBL: 0.4000 mg | SUBLINGUAL_TABLET | SUBLINGUAL | 3 refills | Status: AC | PRN

## 2024-01-17 NOTE — Patient Instructions (Signed)
 Medication Instructions:  Your physician recommends that you continue on your current medications as directed. Please refer to the Current Medication list given to you today.  *If you need a refill on your cardiac medications before your next appointment, please call your pharmacy*  Lab Work: None ordered  If you have labs (blood work) drawn today and your tests are completely normal, you will receive your results only by: MyChart Message (if you have MyChart) OR A paper copy in the mail If you have any lab test that is abnormal or we need to change your treatment, we will call you to review the results.  Testing/Procedures: None ordered today, but we did discuss Echocardiogram and Cardiac CT.  Will need lab work done prior.  If you decide to have this done, please call the office.  Follow-Up: At [facility], you and your health needs are our priority.  As part of our continuing mission to provide you with exceptional heart care, our providers are all part of one team.  This team includes your primary Cardiologist (physician) and Advanced Practice Providers or APPs (Physician Assistants and Nurse Practitioners) who all work together to provide you with the care you need, when you need it.  Your next appointment:   As needed  Provider:   Josefa Beauvais, NP          We recommend signing up for the patient portal called MyChart.  Sign up information is provided on this After Visit Summary.  MyChart is used to connect with patients for Virtual Visits (Telemedicine).  Patients are able to view lab/test results, encounter notes, upcoming appointments, etc.  Non-urgent messages can be sent to your provider as well.   To learn more about what you can do with MyChart, go to [email or web address].   Other Instructions

## 2024-01-21 ENCOUNTER — Telehealth: Payer: Self-pay

## 2024-01-21 DIAGNOSIS — R072 Precordial pain: Secondary | ICD-10-CM

## 2024-01-21 DIAGNOSIS — R0609 Other forms of dyspnea: Secondary | ICD-10-CM

## 2024-01-21 DIAGNOSIS — R0789 Other chest pain: Secondary | ICD-10-CM

## 2024-01-21 NOTE — Telephone Encounter (Addendum)
   Your cardiac CT will be scheduled at one of the below locations:   [facility] [address]  [place], [place] [address]    If scheduled at the [facility] at [address], please enter the parking lot using the [address] entrance and use the FREE valet service at the patient drop-off area. Enter the building and check-in with registration on the main floor.   Please follow these instructions carefully (unless otherwise directed):  An IV will be required for this test and Nitroglycerin will be given.  Hold all erectile dysfunction medications at least 3 days (72 hrs) prior to test. (Ie viagra, cialis, sildenafil, tadalafil, etc)   On the Night Before the Test: Be sure to Drink plenty of water. Do not consume any caffeinated/decaffeinated beverages or chocolate 12 hours prior to your test. Do not take any antihistamines 12 hours prior to your test.  If the patient has contrast allergy: Patient will need a prescription for Prednisone and very clear instructions (as follows): Prednisone 50 mg - take 13 hours prior to test Take another Prednisone 50 mg 7 hours prior to test Take another Prednisone 50 mg 1 hour prior to test Take Benadryl 50 mg 1 hour prior to test Patient must complete all four doses of above prophylactic medications. Patient will need a ride after test due to Benadryl.  On the Day of the Test: Drink plenty of water until 1 hour prior to the test. Do not eat any food 1 hour prior to test. You may take your regular medications prior to the test.  Take metoprolol (Lopressor) two hours prior to test. If you take Furosemide/Hydrochlorothiazide/Spironolactone/Chlorthalidone, please HOLD on the morning of the test. Patients who wear a continuous glucose monitor MUST remove the device prior to scanning. FEMALES- please wear underwire-free bra if available, avoid dresses & tight clothing      After the Test: Drink  plenty of water. After receiving IV contrast, you may experience a mild flushed feeling. This is normal. On occasion, you may experience a mild rash up to 24 hours after the test. This is not dangerous. If this occurs, you can take Benadryl 25 mg, Zyrtec, Claritin, or Allegra and increase your fluid intake. (Patients taking Tikosyn should avoid Benadryl, and may take Zyrtec, Claritin, or Allegra) If you experience trouble breathing, this can be serious. If it is severe call 911 IMMEDIATELY. If it is mild, please call our office.  We will call to schedule your test 2-4 weeks out understanding that some insurance companies will need an authorization prior to the service being performed.   For more information and frequently asked questions, please visit our website : [email or web address]  For non-scheduling related questions, please contact the cardiac imaging nurse navigator should you have any questions/concerns: Cardiac Imaging Nurse Navigators Direct Office Dial: [phone]   For scheduling needs, including cancellations and rescheduling, please call Brittany, [phone].

## 2024-01-23 ENCOUNTER — Other Ambulatory Visit (HOSPITAL_COMMUNITY): Payer: Self-pay

## 2024-01-23 MED ORDER — METOPROLOL TARTRATE 100 MG PO TABS
100.0000 mg | ORAL_TABLET | Freq: Once | ORAL | 0 refills | Status: AC
  Filled 2024-01-23: qty 1, 1d supply, fill #0

## 2024-01-23 NOTE — Telephone Encounter (Signed)
 Contacted the patient to discuss test instructions for echo and coronary cta. Requested a call back if she has any questions the instructions that were sent over. Advised the patient to complete labs and about the one time dose of metoprolol that is to be taken two prior to procedure.

## 2024-01-25 ENCOUNTER — Other Ambulatory Visit (HOSPITAL_COMMUNITY): Payer: Self-pay

## 2024-01-28 ENCOUNTER — Encounter (HOSPITAL_COMMUNITY): Payer: Self-pay

## 2024-01-30 ENCOUNTER — Ambulatory Visit (HOSPITAL_COMMUNITY)
Admission: RE | Admit: 2024-01-30 | Discharge: 2024-01-30 | Disposition: A | Discharge status: Home / Self Care | Source: Ambulatory Visit | Attending: Cardiology | Admitting: Cardiology

## 2024-01-30 DIAGNOSIS — R072 Precordial pain: Secondary | ICD-10-CM | POA: Diagnosis present

## 2024-01-30 MED ORDER — IOHEXOL 350 MG/ML SOLN
95.0000 mL | Freq: Once | INTRAVENOUS | Status: AC | PRN
  Administered 2024-01-30: 95 mL via INTRAVENOUS

## 2024-01-30 MED ORDER — NITROGLYCERIN 0.4 MG SL SUBL
0.8000 mg | SUBLINGUAL_TABLET | Freq: Once | SUBLINGUAL | Status: AC
  Administered 2024-01-30: 0.8 mg via SUBLINGUAL

## 2024-01-31 ENCOUNTER — Ambulatory Visit: Payer: Self-pay | Admitting: General Practice

## 2024-01-31 NOTE — Telephone Encounter (Signed)
 Spoke with the patient to make sure everything went ok with her coronary CTA. Since I was not able to give her a call before to review the instructions with her. Patient that's she followed all the instructions and everything went well. She states she has an appt scheduled on 02/15/24 for her echo and hopes that goes well. I advised the patient that we would be in touch once I get the results. She voiced understanding and thanked me for calling.

## 2024-02-14 ENCOUNTER — Encounter (HOSPITAL_BASED_OUTPATIENT_CLINIC_OR_DEPARTMENT_OTHER): Payer: Self-pay

## 2024-02-15 ENCOUNTER — Other Ambulatory Visit (HOSPITAL_BASED_OUTPATIENT_CLINIC_OR_DEPARTMENT_OTHER)

## 2024-02-15 DIAGNOSIS — R072 Precordial pain: Secondary | ICD-10-CM | POA: Diagnosis not present

## 2024-02-15 DIAGNOSIS — R0609 Other forms of dyspnea: Secondary | ICD-10-CM

## 2024-02-15 DIAGNOSIS — R0789 Other chest pain: Secondary | ICD-10-CM | POA: Diagnosis not present

## 2024-02-15 LAB — ECHOCARDIOGRAM COMPLETE
AR max vel: 2.29 cm2
AV Area VTI: 2.16 cm2
AV Area mean vel: 2.22 cm2
AV Mean grad: 3 mmHg
AV Peak grad: 4.8 mmHg
AV Vena cont: 0.21 cm
Ao pk vel: 1.09 m/s
Area-P 1/2: 3.61 cm2
S' Lateral: 2.84 cm
